# Patient Record
Sex: Male | Born: 1940 | Hispanic: No | Smoking: Current every day smoker
Health system: Southern US, Community
[De-identification: ages and names within clinical notes are randomized; demographics above are authoritative.]

## PROBLEM LIST (undated history)

## (undated) DIAGNOSIS — F32A Depression, unspecified: Secondary | ICD-10-CM

## (undated) DIAGNOSIS — E539 Vitamin B deficiency, unspecified: Secondary | ICD-10-CM

## (undated) DIAGNOSIS — I10 Essential (primary) hypertension: Secondary | ICD-10-CM

## (undated) DIAGNOSIS — F329 Major depressive disorder, single episode, unspecified: Secondary | ICD-10-CM

## (undated) DIAGNOSIS — E559 Vitamin D deficiency, unspecified: Secondary | ICD-10-CM

## (undated) DIAGNOSIS — K279 Peptic ulcer, site unspecified, unspecified as acute or chronic, without hemorrhage or perforation: Secondary | ICD-10-CM

## (undated) DIAGNOSIS — R739 Hyperglycemia, unspecified: Secondary | ICD-10-CM

## (undated) DIAGNOSIS — I2510 Atherosclerotic heart disease of native coronary artery without angina pectoris: Secondary | ICD-10-CM

## (undated) DIAGNOSIS — H269 Unspecified cataract: Secondary | ICD-10-CM

## (undated) DIAGNOSIS — J449 Chronic obstructive pulmonary disease, unspecified: Secondary | ICD-10-CM

## (undated) DIAGNOSIS — I509 Heart failure, unspecified: Secondary | ICD-10-CM

## (undated) DIAGNOSIS — E785 Hyperlipidemia, unspecified: Secondary | ICD-10-CM

## (undated) DIAGNOSIS — T148XXA Other injury of unspecified body region, initial encounter: Secondary | ICD-10-CM

## (undated) HISTORY — DX: Essential (primary) hypertension: I10

## (undated) HISTORY — DX: Vitamin D deficiency, unspecified: E55.9

## (undated) HISTORY — DX: Heart failure, unspecified: I50.9

## (undated) HISTORY — PX: HAND SURGERY: SHX662

## (undated) HISTORY — DX: Vitamin B deficiency, unspecified: E53.9

## (undated) HISTORY — DX: Hyperlipidemia, unspecified: E78.5

## (undated) HISTORY — DX: Peptic ulcer, site unspecified, unspecified as acute or chronic, without hemorrhage or perforation: K27.9

## (undated) HISTORY — PX: BACK SURGERY: SHX140

## (undated) HISTORY — DX: Other injury of unspecified body region, initial encounter: T14.8XXA

## (undated) HISTORY — DX: Unspecified cataract: H26.9

## (undated) HISTORY — DX: Hyperglycemia, unspecified: R73.9

## (undated) HISTORY — DX: Depression, unspecified: F32.A

## (undated) HISTORY — DX: Major depressive disorder, single episode, unspecified: F32.9

## (undated) HISTORY — DX: Chronic obstructive pulmonary disease, unspecified: J44.9

## (undated) HISTORY — DX: Atherosclerotic heart disease of native coronary artery without angina pectoris: I25.10

---

## 2003-05-21 ENCOUNTER — Other Ambulatory Visit: Payer: Self-pay

## 2003-06-03 ENCOUNTER — Other Ambulatory Visit: Payer: Self-pay

## 2005-06-09 ENCOUNTER — Emergency Department: Payer: Self-pay | Admitting: Emergency Medicine

## 2005-09-03 ENCOUNTER — Inpatient Hospital Stay: Payer: Self-pay | Admitting: Internal Medicine

## 2005-10-02 ENCOUNTER — Encounter: Payer: Self-pay | Admitting: Cardiovascular Disease

## 2005-10-14 ENCOUNTER — Emergency Department: Payer: Self-pay

## 2006-01-01 ENCOUNTER — Other Ambulatory Visit: Payer: Self-pay

## 2006-01-01 ENCOUNTER — Emergency Department: Payer: Self-pay | Admitting: Emergency Medicine

## 2006-01-05 ENCOUNTER — Emergency Department: Payer: Self-pay | Admitting: Emergency Medicine

## 2006-02-04 ENCOUNTER — Other Ambulatory Visit: Payer: Self-pay

## 2006-02-04 ENCOUNTER — Emergency Department: Payer: Self-pay | Admitting: Unknown Physician Specialty

## 2006-03-28 ENCOUNTER — Other Ambulatory Visit: Payer: Self-pay

## 2006-03-28 ENCOUNTER — Inpatient Hospital Stay: Payer: Self-pay | Admitting: Internal Medicine

## 2007-02-09 ENCOUNTER — Inpatient Hospital Stay: Payer: Self-pay | Admitting: Internal Medicine

## 2007-02-09 ENCOUNTER — Other Ambulatory Visit: Payer: Self-pay

## 2007-02-26 ENCOUNTER — Other Ambulatory Visit: Payer: Self-pay

## 2007-02-26 ENCOUNTER — Inpatient Hospital Stay: Payer: Self-pay | Admitting: Internal Medicine

## 2007-07-12 ENCOUNTER — Inpatient Hospital Stay: Payer: Self-pay | Admitting: Internal Medicine

## 2007-08-22 ENCOUNTER — Other Ambulatory Visit: Payer: Self-pay

## 2007-08-22 ENCOUNTER — Emergency Department: Payer: Self-pay | Admitting: Emergency Medicine

## 2008-04-29 ENCOUNTER — Inpatient Hospital Stay: Payer: Self-pay | Admitting: Internal Medicine

## 2008-06-16 ENCOUNTER — Ambulatory Visit: Payer: Self-pay | Admitting: Internal Medicine

## 2008-07-02 ENCOUNTER — Encounter: Payer: Self-pay | Admitting: Cardiovascular Disease

## 2008-07-21 ENCOUNTER — Encounter: Payer: Self-pay | Admitting: Cardiovascular Disease

## 2008-10-21 ENCOUNTER — Inpatient Hospital Stay: Payer: Self-pay | Admitting: Internal Medicine

## 2008-11-19 ENCOUNTER — Inpatient Hospital Stay: Payer: Self-pay | Admitting: Internal Medicine

## 2009-01-18 ENCOUNTER — Inpatient Hospital Stay: Payer: Medicare Other | Admitting: Internal Medicine

## 2009-02-18 ENCOUNTER — Ambulatory Visit: Payer: Self-pay | Admitting: Cardiovascular Disease

## 2009-02-18 DIAGNOSIS — R000 Tachycardia, unspecified: Secondary | ICD-10-CM

## 2009-02-18 DIAGNOSIS — R062 Wheezing: Secondary | ICD-10-CM

## 2009-02-18 DIAGNOSIS — R05 Cough: Secondary | ICD-10-CM

## 2009-02-18 DIAGNOSIS — R0602 Shortness of breath: Secondary | ICD-10-CM | POA: Insufficient documentation

## 2009-02-18 DIAGNOSIS — J4489 Other specified chronic obstructive pulmonary disease: Secondary | ICD-10-CM | POA: Insufficient documentation

## 2009-02-18 DIAGNOSIS — J449 Chronic obstructive pulmonary disease, unspecified: Secondary | ICD-10-CM

## 2009-02-18 DIAGNOSIS — E785 Hyperlipidemia, unspecified: Secondary | ICD-10-CM | POA: Insufficient documentation

## 2009-02-18 DIAGNOSIS — I2510 Atherosclerotic heart disease of native coronary artery without angina pectoris: Secondary | ICD-10-CM | POA: Insufficient documentation

## 2009-02-18 DIAGNOSIS — F17200 Nicotine dependence, unspecified, uncomplicated: Secondary | ICD-10-CM | POA: Insufficient documentation

## 2009-02-25 ENCOUNTER — Telehealth: Payer: Self-pay | Admitting: Cardiovascular Disease

## 2009-02-26 ENCOUNTER — Encounter: Payer: Self-pay | Admitting: Cardiovascular Disease

## 2009-04-12 ENCOUNTER — Encounter: Payer: Self-pay | Admitting: Cardiovascular Disease

## 2009-04-16 ENCOUNTER — Encounter: Payer: Self-pay | Admitting: Cardiovascular Disease

## 2009-05-04 ENCOUNTER — Encounter: Payer: Self-pay | Admitting: Cardiovascular Disease

## 2009-05-25 ENCOUNTER — Ambulatory Visit: Payer: Self-pay | Admitting: Cardiovascular Disease

## 2009-08-21 ENCOUNTER — Encounter: Payer: Self-pay | Admitting: Cardiovascular Disease

## 2009-08-21 ENCOUNTER — Ambulatory Visit: Payer: Self-pay | Admitting: Internal Medicine

## 2009-08-21 ENCOUNTER — Inpatient Hospital Stay: Payer: Medicare Other | Admitting: Internal Medicine

## 2009-08-22 ENCOUNTER — Encounter: Payer: Self-pay | Admitting: Cardiovascular Disease

## 2009-08-24 ENCOUNTER — Encounter: Payer: Self-pay | Admitting: Cardiovascular Disease

## 2009-09-08 ENCOUNTER — Ambulatory Visit: Payer: Self-pay | Admitting: Cardiovascular Disease

## 2009-09-08 ENCOUNTER — Inpatient Hospital Stay: Payer: Medicare Other | Admitting: Surgery

## 2009-09-11 ENCOUNTER — Encounter: Payer: Self-pay | Admitting: Cardiovascular Disease

## 2009-09-12 LAB — AFP TUMOR MARKER: AFP-Tumor Marker: 2.3 ng/mL (ref 0.0–8.3)

## 2009-09-12 LAB — CEA: CEA: 6.6 ng/mL — ABNORMAL HIGH (ref 0.0–4.7)

## 2009-10-06 ENCOUNTER — Ambulatory Visit: Payer: Medicare Other | Admitting: Gastroenterology

## 2009-10-13 ENCOUNTER — Emergency Department: Payer: Medicare Other | Admitting: Emergency Medicine

## 2009-11-11 ENCOUNTER — Ambulatory Visit: Payer: Medicare Other | Admitting: Gastroenterology

## 2009-11-13 LAB — PATHOLOGY REPORT

## 2010-01-04 ENCOUNTER — Emergency Department: Payer: Medicare Other | Admitting: Emergency Medicine

## 2010-02-15 NOTE — Letter (Signed)
[facility]   Imported By: Harlon Flor 09/16/2009 10:36:48  _____________________________________________________________________  External Attachment:    Type:   Image     Comment:   External Document

## 2010-02-15 NOTE — Consult Note (Signed)
[facility]   Imported By: Harlon Flor 08/23/2009 10:35:48  _____________________________________________________________________  External Attachment:    Type:   Image     Comment:   External Document

## 2010-02-15 NOTE — Assessment & Plan Note (Signed)
Summary: F3M/AMD   Visit Type:  Follow-up Primary Provider:  Lajoyce Lauber, MD  CC:  no cp, some chest muscle spasms. Short of breath, worse with walking. Starting O2 stat 96%with 2 liters, and walked down hall went down to 90% with 2l of O2. After sitting for a minute went back up to 96% with 2L. Not many problems with swelling in ankle or feet. Marland Kitchen  History of Present Illness: 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  He uses nebulizers at home, 4 times a day. Sometimes he uses a double dose of medication in his nebulizer. Usessymbicort, albuterol inhaler and Spiriva. he has cut back on his smoking to one half pack per day and actually shows me the cigarette package. He continues to have severe shortness of breath with any exertion and uses chronic oxygen. He denies any significant chest pain, chest tightness or neck discomfort or back discomfort.  he has been taking Lasix 40 mg daily although the package asked him to take 60. He has not had any significant lower extremity edema and some time and states that he is able to get his shoes on with no problem.   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 2 Puffs Two Times A Day 2)  Lisinopril 5 Mg Tabs (Lisinopril) .... Take One Tablet By Mouth Daily 3)  Lipitor 20 Mg Tabs (Atorvastatin Calcium) .... Take One Tablet By Mouth At Bedtime. 4)  Omeprazole 40 Mg Cpdr (Omeprazole) .Marland Kitchen.. 1 Tab By Mouth Once Daily 5)  Aspirin 81 Mg Tbec (Aspirin) .... Take One Tablet By Mouth Daily 6)  Singulair 10 Mg Tabs (Montelukast Sodium) .Marland Kitchen.. 1 Tab By Mouth Once Daily 7)  Theo-24 300 Mg Xr24h-Cap (Theophylline) .Marland Kitchen.. 1 Tab By Mouth Once Daily 8)  Spiriva Handihaler 18 Mcg Caps (Tiotropium Bromide Monohydrate) .... As Needed 9)  Furosemide 40 Mg Tabs (Furosemide) .... Take One Tablet By Mouth Daily. 10)  Albuterol Sulfate (2.5 Mg/70ml) 0.083% Nebu (Albuterol Sulfate) .... As Needed 11)  Ipratropium-Albuterol 0.5-2.5 (3) Mg/80ml Soln (Ipratropium-Albuterol) .... As Needed 12)  Spironolactone 25 Mg Tabs (Spironolactone) .Marland Kitchen.. 1 Tab By Mouth Once Daily 13)  Alprazolam 0.25 Mg Tabs (Alprazolam) .Marland Kitchen.. 1 Tab By Mouth Once Daily 14)  Bystolic 5 Mg Tabs (Nebivolol Hcl) .... 1/2 By Mouth Once Daily 15)  Chlorzoxazone 500 Mg Tabs (Chlorzoxazone) .Marland Kitchen.. 1 By Mouth Two Times A Day As Needed For Muscle Spasm  Allergies (verified): 1)  ! Penicillin  Past History:  Past Medical History: Last updated: 02/10/2009 1. COPD on home O2 of at least 2L, sometimes 3L.  He was last hospitalized November 2010. Prior to that, October 2010. 2. Hypertension. 3. Hyperlipidemia. 4. CAD. Reports his last catheterization was at least 3-4 years ago b Dr. Juliann Pares. Apparently, he was suppose to be scheduled for what sounds like a stress test this coming week. 5. History of CHF with systolic and diastolic dysfunction. He has an EF of 45% and moderate LVH. An Echocardiogram November 2010. 6. Depression 7. Steriod-induced hyperglycemia.  A1c from November 2010 was 6.2. 8. Peptic ulcer disease. 9. Cataracts.  Past Surgical History: Last updated: 02/10/2009 1. Back Surgery 2. Hand Surgery  Family History: Last updated: 02/10/2009 Mother: Had a stroke and diabetes. Siblings: Sister has asthma and diabetes.  Social History: Last updated: 02/10/2009 Single  Tobacco Use - Yes.  Alcohol Use - no Drug Use - no  Risk Factors: Alcohol Use: 0 (05/25/2009) Caffeine Use: 1 cup (05/25/2009)  Risk Factors: Smoking Status: current (05/25/2009)  Social History: Alcohol drinks/day:  0 Caffeine use/day:  1 cup  Review of Systems       The patient complains of dyspnea on exertion.  The patient denies fever, weight loss, weight gain, vision loss, decreased hearing, hoarseness, chest pain, syncope, peripheral edema, prolonged cough, abdominal pain, incontinence, muscle weakness, depression, and enlarged lymph nodes.    Vital Signs:  Patient profile:   70 year old male Height:      70 inches Weight:      197.50 pounds BMI:     28.44 Pulse rate:   90 / minute Pulse rhythm:   regular BP sitting:   132 / 80  (left arm) Cuff size:   regular  Vitals Entered By: Mercer Pod (May 25, 2009 3:23 PM)  Physical Exam  General:  elderly gentleman who is very short of breath, audible wheezing on walking to the exam room, appears comfortable on oxygen though still short of breath, HEENT exam is benign, oropharynx is clear, neck is supple with no JVP or carotid bruits, heart sounds are regular but distant with S1-S2, lungs are clear though diminished bilaterally, abdominal exam is benign, no significant lower extremity edema, neurologic exam is nonfocal skin is warm and dry.   Impression & Recommendations:  Problem # 1:  CAD, NATIVE VESSEL (ICD-414.01)  history of CAD, on aspirin and statin. No symptoms of chest pain or anginal equivalent. Recent stress test and catheterization. We have been having difficulty obtaining  these reports.  Heart rate is improved from his last clinic visit though still mildly elevated. This is likely due to being on theophylline and due to his underlying severe COPD. If he has no side effects from bystolic, this could be increased to 5 mg daily from 2.5 mg daily. His blood pressure would tolerate this easily.   The following medications were removed from the medication list:    Diltiazem Hcl Er Beads 180 Mg Xr24h-cap (Diltiazem hcl er beads) .Marland Kitchen... 1 tab by mouth daily His updated medication list for this problem includes:    Lisinopril 5 Mg Tabs (Lisinopril) .Marland Kitchen... Take one tablet by mouth daily    Aspirin 81 Mg Tbec (Aspirin) .Marland Kitchen... Take one tablet by mouth daily    Bystolic 5 Mg Tabs (Nebivolol hcl) .Marland Kitchen... 1/2 by mouth once daily  The following medications were removed from the medication list:    Diltiazem Hcl Er Beads 180 Mg Xr24h-cap (Diltiazem hcl er beads) .Marland Kitchen... 1 tab by mouth daily His updated medication list for this problem includes:    Lisinopril 5 Mg Tabs (  Lisinopril) .Marland Kitchen... Take one tablet by mouth daily    Aspirin 81 Mg Tbec (Aspirin) .Marland Kitchen... Take one tablet by mouth daily    Bystolic 5 Mg Tabs (Nebivolol hcl) .Marland Kitchen... 1/2 by mouth once daily  Problem # 2:  HYPERLIPIDEMIA-MIXED (ICD-272.4)  We'll continue on Lipitor at its current dose. We'll try to obtain his most recent lipid panel from Dr. Beverely Risen.  His updated medication list for this problem includes:    Lipitor 20 Mg Tabs (Atorvastatin calcium) .Marland Kitchen... Take one tablet by mouth at bedtime.  His updated medication list for this problem includes:    Lipitor 20 Mg Tabs (Atorvastatin calcium) .Marland Kitchen... Take one tablet by mouth at bedtime.  Problem # 3:  COPD (ICD-496) Severe COPD on chronic oxygen. He is severely limited in his ability to exert himself to 2 shortness of breath. We have strongly encouraged him to stop smoking and he states that he is trying and is now down to one half pack per day. We have counseled him  about smoking he is well aware of the problems as he now has severe COPD. he likely has pulmonary hypertension has been managed with daily Lasix. His appears to be under adequate control with no significant lower extremity edema.   The following medications were removed from the medication list:    Xopenex Hfa 45 Mcg/act Aero (Levalbuterol tartrate) .Marland Kitchen... 2 puffs every morning and every evening and as needed for shortness of breath His updated medication list for this problem includes:    Symbicort 80-4.5 Mcg/act Aero (Budesonide-formoterol fumarate) .Marland Kitchen... 2 puffs two times a day    Singulair 10 Mg Tabs (Montelukast sodium) .Marland Kitchen... 1 tab by mouth once daily    Theo-24 300 Mg Xr24h-cap (Theophylline) .Marland Kitchen... 1 tab by mouth once daily    Spiriva Handihaler 18 Mcg Caps (Tiotropium bromide monohydrate) .Marland Kitchen... As needed    Albuterol Sulfate (2.5 Mg/62ml) 0.083% Nebu (Albuterol sulfate) .Marland Kitchen... As needed    Ipratropium-albuterol 0.5-2.5 (3) Mg/39ml Soln (Ipratropium-albuterol) .Marland Kitchen... As needed

## 2010-02-15 NOTE — Letter (Signed)
[facility]   Imported By: Harlon Flor 09/08/2009 16:29:29  _____________________________________________________________________  External Attachment:    Type:   Image     Comment:   External Document

## 2010-02-15 NOTE — Progress Notes (Signed)
Summary: BP issues  Phone Note From Other Clinic   Caller: Vanessa with [facility] Summary of Call: Pts BP low - ranging from 78-98 systolic.  Has been running low since starting bystolic.  instructed Erie Noe to have pt stop bystolic and increase dilt to 045 per last office note.  Erie Noe will have teleport call tomorrow with pts BP.      New/Updated Medications: DILTIAZEM HCL ER BEADS 180 MG XR24H-CAP (DILTIAZEM HCL ER BEADS) 1 tab by mouth daily Prescriptions: DILTIAZEM HCL ER BEADS 180 MG XR24H-CAP (DILTIAZEM HCL ER BEADS) 1 tab by mouth daily  #30 x 6   Entered by:   Charlena Cross, RN, BSN   Authorized by:   Dossie Arbour MD   Signed by:   Charlena Cross, RN, BSN on 02/25/2009   Method used:   Electronically to        [facility]* (retail)       [address] [place], [place]  [address]       Ph: [phone]       Fax: [phone]   RxID:   [id number]

## 2010-02-15 NOTE — Consult Note (Signed)
[facility]   Imported By: Harlon Flor 08/23/2009 10:36:05  _____________________________________________________________________  External Attachment:    Type:   Image     Comment:   External Document

## 2010-02-15 NOTE — Assessment & Plan Note (Signed)
Summary: New PT   Visit Type:  New patient  CC:  SOB, chest pain, swelling in both feet, and and muscle spasms.  History of Present Illness: 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  [name] states that his shortness of breath is severe. He is seen by Dr. Doylene Canard who helps diminish his emphysema. He uses nebulizers at home, and simple cord about daily as well as p.r.n. basis. He seems to think that simple cord on a p.r.n. basis might help his symptoms. He states that he does very small type of oxygen and a short amount of time. He also states that his heart was stopped several weeks ago by Dr. Lennette Bihari presumably to make sure that this was a contributing to his COPD and shortness of breath. Diltiazem was started presumably for her elevated heart rate.  [name] was preceded by Dr. Juliann Pares and he mentioned that he had a recent stress test in July 2010 though the results are uncertain. He also has had a prior catheterization in the recent past though these results are also unavailable to [place] at this time.[name] denies any significant chest pain, syncope or near syncope. He does have a chronic deep cough.  Current Medications (verified): 1)  Symbicort 80-4.5 Mcg/act Aero (Budesonide-Formoterol Fumarate) .... 2 Puffs Two Times A Day 2)  Lisinopril 10 Mg Tabs (Lisinopril) .... Take One Tablet By Mouth Daily 3)  Lipitor 20 Mg Tabs (Atorvastatin Calcium) .... Take One Tablet By Mouth At Bedtime. 4)  Omeprazole 40 Mg Cpdr (Omeprazole) .Marland Kitchen.. 1 Tab By Mouth Once Daily 5)  Magnesium Oxide 400 Mg Tabs (Magnesium Oxide) .Marland Kitchen.. 1 Tab By Mouth Two Times A Day 6)   Aspirin 81 Mg Tbec (Aspirin) .... Take One Tablet By Mouth Daily 7)  Singulair 10 Mg Tabs (Montelukast Sodium) .Marland Kitchen.. 1 Tab By Mouth Once Daily 8)  Theo-24 300 Mg Xr24h-Cap (Theophylline) .Marland Kitchen.. 1 Tab By Mouth Once Daily 9)  Spiriva Handihaler 18 Mcg Caps (Tiotropium Bromide Monohydrate) .... As Needed 10)  Lasix 80 Mg Tabs (Furosemide) .Marland Kitchen.. 1 Tablet Two Times A Day 11)  Diltiazem Hcl Er Beads 120 Mg Xr24h-Cap (Diltiazem Hcl Er Beads) .... Take One Capsule By Mouth Twice A Day 12)  Albuterol Sulfate (2.5 Mg/2ml) 0.083% Nebu (Albuterol Sulfate) .... As Needed 13)  Ipratropium-Albuterol 0.5-2.5 (3) Mg/61ml Soln (Ipratropium-Albuterol) .... As Needed 14)  Bupropion Hcl 150 Mg Xr12h-Tab (Bupropion Hcl) .Marland Kitchen.. 1 Tablet By Mouth Once Daily 15)  Spironolactone 25 Mg Tabs (Spironolactone) .Marland Kitchen.. 1 Tab By Mouth Once Daily 16)  Prednisone 5 Mg Tabs (Prednisone) .... 2 Tablets By Mouth Once Daily 17)  Daily Vitamins  Tabs (Multiple Vitamin) .Marland Kitchen.. 1 Tablet By Mouth Once Daily 18)  Alprazolam 0.25 Mg Tabs (Alprazolam) .Marland Kitchen.. 1 Tab By Mouth Once Daily 19)  Nicoderm Cq 14 Mg/24hr Pt24 (Nicotine) .... Apply 1 Patch Daily  Allergies (verified): 1)  ! Penicillin  Past History:  Past Medical History: Last updated: 02/10/2009 1. COPD on home O2 of at least 2L, sometimes 3L. He was last hospitalized November 2010. Prior to that, October 2010. 2. Hypertension. 3. Hyperlipidemia. 4. CAD. Reports his last catheterization was at least 3-4 years  ago b Dr. Juliann Pares. Apparently, he was suppose to be scheduled for what sounds like a stress test this coming week. 5. History of CHF with systolic and diastolic dysfunction. He has an EF of 45% and moderate LVH. An Echocardiogram November 2010. 6. Depression 7. Steriod-induced hyperglycemia. A1c from November 2010 was 6.2. 8. Peptic ulcer disease. 9. Cataracts.  Past Surgical History: Last updated: 02/10/2009 1. Back Surgery 2. Hand Surgery  Family History: Last updated:  02/10/2009 Mother: Had a stroke and diabetes. Siblings: Sister has asthma and diabetes.  Social History: Last updated: 02/10/2009 Single  Tobacco Use - Yes.  Alcohol Use - no Drug Use - no  Risk Factors: Smoking Status: current (02/10/2009)  Review of Systems       abdominal swelling  Vital Signs:  Patient profile:   70 year old male Height:      70 inches Weight:      213.25 pounds BMI:     30.71 Pulse rate:   118 / minute Pulse rhythm:   regular BP sitting:   132 / 82  (left arm) Cuff size:   large  Vitals Entered By: Mercer Pod (February 18, 2009 2:36 PM)  Physical Exam  General:  [name] is on nasal cannula oxygen, appears short of breath with any movement or talking, HEENT exam is benign, oropharynx is clear, neck is supple with no JVP or carotid bruits, heart sounds are regular with S1-S2 and 2/6 systolic ejection murmur at the left sternal border, lungs are clear to auscultation though moderately diminished throughout bilaterally, normal exam is notable for obesity, soft, nontender. He has trace lower extremity edema around his ankles bilaterally, neurological exam is grossly nonfocal the full exam was not completed, skin is warm and dry, pulses are equal and symmetrical in his upper and lower extremities.    EKG  Procedure date:  02/18/2009  Findings:      EKG shows sinus tachycardia with a rate of 110 beats per minute, rare PVC, right bundle branch block, left axis deviation. No prior EKG available for comparison at this time.  Impression & Recommendations:  Problem # 1:  SHORTNESS OF BREATH (ICD-786.05) [name] shortness of breath is likely due predominantly to his severe COPD. He is on chronic nasal cannula oxygen. Per his report, he has underlying coronary artery disease. He had a stress test in July 2010 and we will try to obtain this report for our records. He also has had a cardiac catheterization and will also try to obtain this report  for our records. He is currently on aggressive medical management for his coronary disease. We'll try to obtain his most recent lipid panel for our review. I have suggested to him that he increase his aspirin to 81 mg x2 daily basis.[name] does need a rescue inhaler he is using a similar core, p.r.n. basis throughout the day. We have written a prescription for Xopenex inhaler. The following medications were removed from the medication list:    Carvedilol 3.125 Mg Tabs (Carvedilol) .Marland Kitchen... Take one tablet by mouth twice a day His updated medication list for this problem includes:    Lisinopril 10 Mg Tabs (Lisinopril) .Marland Kitchen... Take one tablet by mouth daily    Aspirin 81 Mg Tbec (Aspirin) .Marland Kitchen... Take one tablet by mouth daily    Lasix 80 Mg Tabs (Furosemide) .Marland Kitchen... 1 tablet two times a day    Diltiazem Hcl Er Beads 120 Mg Xr24h-cap (Diltiazem hcl er beads) .Marland Kitchen... Take one  capsule by mouth twice a day    Spironolactone 25 Mg Tabs (Spironolactone) .Marland Kitchen... 1 tab by mouth once daily    Bystolic 5 Mg Tabs (Nebivolol hcl) .Marland Kitchen... 1 tab by mouth daily  Problem # 2:  TACHYCARDIA (ICD-785) the etiology of his tachycardia is likely multifactorial. He has been told that he has an elevated heart rate on numerous times in the past. His heart rate is 110 on today's visit. I suspect that his heart rate is elevated due to his underlying COPD. He does have moderate LVH and I suspect diastolic dysfunction. His ejection fraction is moderately reduced. As the rate is increased, his shortness of breath will certainly improve given his underlying cardiac pathology.  An effort to slow his heart rate, we will continue the diltiazem dose start by bystolic 5 mg daily. I suspect that he might need a higher dose such as 10 mg a daily basis. We'll see him back in one to 2 months to see how heart rate is running.  Problem # 3:  HYPERLIPIDEMIA-MIXED (ICD-272.4) [name] is currently on Lipitor and we'll try to obtain his most recent  cholesterol panel for our records. His updated medication list for this problem includes:    Lipitor 20 Mg Tabs (Atorvastatin calcium) .Marland Kitchen... Take one tablet by mouth at bedtime.  Problem # 4:  CAD, NATIVE VESSEL (ICD-414.01) per his report, Mr. Zella Ball is coronary disease. We'll try to obtain his most recent heart catheterization and stress test for our records. The following medications were removed from the medication list:    Carvedilol 3.125 Mg Tabs (Carvedilol) .Marland Kitchen... Take one tablet by mouth twice a day His updated medication list for this problem includes:    Lisinopril 10 Mg Tabs (Lisinopril) .Marland Kitchen... Take one tablet by mouth daily    Aspirin 81 Mg Tbec (Aspirin) .Marland Kitchen... Take one tablet by mouth daily    Diltiazem Hcl Er Beads 120 Mg Xr24h-cap (Diltiazem hcl er beads) .Marland Kitchen... Take one capsule by mouth twice a day    Bystolic 5 Mg Tabs (Nebivolol hcl) .Marland Kitchen... 1 tab by mouth daily  Patient Instructions: 1)  Your physician recommends that you schedule a follow-up appointment in: 3 months 2)  Your physician has recommended you make the following change in your medication: start xopenex inhaler twice in the morning and twice in the evening and as needed for shortness of breath, start bystolic 5 mg daiyl Prescriptions: BYSTOLIC 5 MG TABS (NEBIVOLOL HCL) 1 tab by mouth daily  #30 x 6   Entered by:   Charlena Cross, RN, BSN   Authorized by:   Ryan Arbour MD   Signed by:   Charlena Cross, RN, BSN on 02/18/2009   Method used:   Electronically to        [facility]* (retail)       [address] [place], [place]  [address]       Ph: [phone]       Fax: [phone]   RxID:   [id number] XOPENEX HFA 45 MCG/ACT AERO (LEVALBUTEROL TARTRATE) 2 puffs every morning and every evening and as needed for shortness of breath  #1 x 6   Entered by:   Charlena Cross, RN, BSN   Authorized by:   Ryan Arbour MD   Signed by:   Charlena Cross, RN, BSN on  02/18/2009   Method used:   Electronically to  [facility]* (retail)       [address] [place], [place]  [address]       Ph: [phone]       Fax: [phone]   RxID:   [id number]   Appended Document: New PT Contacted by Dr. Beverely Risen. The patient has difficulty paying for medications. She will provide some xopenex inh for as needed. She would like to d/c the bystolic and incresae the dose of the diltiazem for improved rate control. She will increse the dose to 180 mg by mouth daily.   Appended Document: New PT ECHO from outside facility showing EF of 20% with moderate MR. Biatrial enlargement. RVSP not measured.  Stress dobutamin was subtherapeutic as taret HR not achieved. ?Minimal ischemia, apical scar.

## 2010-03-01 ENCOUNTER — Encounter: Payer: Medicare Other | Admitting: Internal Medicine

## 2010-03-17 ENCOUNTER — Encounter: Payer: Medicare Other | Admitting: Internal Medicine

## 2010-04-17 ENCOUNTER — Encounter: Payer: Medicare Other | Admitting: Internal Medicine

## 2010-05-17 ENCOUNTER — Encounter: Payer: Medicare Other | Admitting: Internal Medicine

## 2010-06-03 ENCOUNTER — Inpatient Hospital Stay: Payer: Medicare Other | Admitting: Internal Medicine

## 2010-06-17 ENCOUNTER — Encounter: Payer: Medicare Other | Admitting: Internal Medicine

## 2010-07-17 ENCOUNTER — Encounter: Payer: Medicare Other | Admitting: Internal Medicine

## 2010-08-02 ENCOUNTER — Encounter: Payer: Self-pay | Admitting: Cardiovascular Disease

## 2010-09-20 ENCOUNTER — Inpatient Hospital Stay: Payer: Medicare Other | Admitting: Internal Medicine

## 2010-09-20 ENCOUNTER — Ambulatory Visit: Payer: Medicare Other

## 2010-09-21 ENCOUNTER — Encounter: Payer: Self-pay | Admitting: Cardiovascular Disease

## 2010-09-21 DIAGNOSIS — R079 Chest pain, unspecified: Secondary | ICD-10-CM

## 2010-10-05 ENCOUNTER — Encounter: Payer: Self-pay | Admitting: *Deleted

## 2010-10-05 ENCOUNTER — Encounter: Payer: Self-pay | Admitting: Cardiovascular Disease

## 2010-10-05 ENCOUNTER — Ambulatory Visit (INDEPENDENT_AMBULATORY_CARE_PROVIDER_SITE_OTHER): Payer: Medicare Other | Admitting: Cardiovascular Disease

## 2010-10-05 DIAGNOSIS — Z01810 Encounter for preprocedural cardiovascular examination: Secondary | ICD-10-CM

## 2010-10-05 DIAGNOSIS — J449 Chronic obstructive pulmonary disease, unspecified: Secondary | ICD-10-CM

## 2010-10-05 DIAGNOSIS — F17200 Nicotine dependence, unspecified, uncomplicated: Secondary | ICD-10-CM

## 2010-10-05 DIAGNOSIS — R0602 Shortness of breath: Secondary | ICD-10-CM

## 2010-10-05 DIAGNOSIS — I2510 Atherosclerotic heart disease of native coronary artery without angina pectoris: Secondary | ICD-10-CM

## 2010-10-05 DIAGNOSIS — E785 Hyperlipidemia, unspecified: Secondary | ICD-10-CM

## 2010-10-05 DIAGNOSIS — R079 Chest pain, unspecified: Secondary | ICD-10-CM | POA: Insufficient documentation

## 2010-10-05 NOTE — Assessment & Plan Note (Signed)
Despite his profoundly bad COPD and symptoms, he continues to smoke. Smoking cessation has always been progressively mentioned.

## 2010-10-05 NOTE — Assessment & Plan Note (Signed)
He has had frequent episodes of chest pain, is unable to perform any of the stress test. He was unable to lie flat for the cardiac catheterization in the setting of severe COPD exacerbation. He is still interested in having a cardiac catheterization done and at his request, we will try to reschedule this for next week.

## 2010-10-05 NOTE — Assessment & Plan Note (Signed)
Given that he is at high risk of peripheral vascular disease and CAD, would continue aggressive cholesterol management.

## 2010-10-05 NOTE — Patient Instructions (Signed)
You are doing well. No medication changes were made. We will check lab work today in preparation for the cardiac cath next week. We will call you to schedule the cardiac cath Please call us if you have new issues that need to be addressed before your next appt.  We will call you for a follow up Appt. In 6 months

## 2010-10-05 NOTE — Assessment & Plan Note (Signed)
He has end-stage COPD with recent hospital admission for COPD exacerbation, exacerbation following a dobutamine stress test. He is winding down on his prednisone. I suspect his disease will be severe to the point where he will need low-dose prednisone on a regular basis.

## 2010-10-05 NOTE — Progress Notes (Signed)
Patient ID: [name], male    DOB: [birth year], 70 y.o.   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  He was admitted to the hospital after a dobutamine stress test caused significant chest palpitations and shortness of breath. He is unable to tolerate a treadmill, lexiscan or a dobutamine stress test. He reported his shortness of breath had been getting worse over the past several weeks prior to the test, likely from COPD exacerbation. He was treated at [facility] for his COPD exacerbation. An attempt was made at a cardiac catheterization though he was unable to lie flat secondary to his COPD. He was discharged after shortness of breath had improved to some degree with maximum medical therapy for his COPD. He has several days of steroids left. His legs are very weak and he is scheduled to start working with physical therapy.   He reports that he was recently seen by [facility] medical and his lisinopril was doubled to 10 mg b.i.d. And Lasix was increased to 60 mg daily.  He is very concerned about recent episodes of chest pain. He would like to try to reschedule his cardiac catheterization.  EKG shows sinus tachycardia with rate 101 beats per minute, right bundle branch block, left axis deviation      Outpatient Encounter Prescriptions as of 10/05/2010  Medication Sig Dispense Refill  . albuterol (PROVENTIL) (2.5 MG/3ML) 0.083% nebulizer solution Take 2.5 mg by nebulization as needed.        . ALPRAZolam (XANAX) 0.25 MG tablet Take 0.25 mg by mouth daily.        Marland Kitchen aspirin (ASPIR-81) 81  MG EC tablet Take 81 mg by mouth daily.        Marland Kitchen atorvastatin (LIPITOR) 20 MG tablet Take 20 mg by mouth at bedtime.        . chlorzoxazone (PARAFON) 500 MG tablet Take 500 mg by mouth 2 (two) times daily. As needed for muscle spasm       . cyclobenzaprine (FEXMID) 7.5 MG tablet Take 7.5 mg by mouth 2 (two) times daily as needed.        . furosemide (LASIX) 40 MG tablet Take one and one half tablet daily.      Marland Kitchen gabapentin (NEURONTIN) 300 MG capsule Take 300 mg by mouth 1 day or 1 dose.        . hydrocodone-acetaminophen (LORCET-HD) 5-500 MG per capsule Take 1 capsule by mouth every 8 (eight) hours as needed.        Marland Kitchen ipratropium-albuterol (DUONEB) 0.5-2.5 (3) MG/3ML SOLN Take 3 mLs by nebulization as needed.        Marland Kitchen lisinopril (PRINIVIL,ZESTRIL) 5 MG tablet Take 10 mg by mouth daily.       . montelukast (SINGULAIR) 10 MG tablet Take 10 mg by mouth daily.        . nebivolol (BYSTOLIC) 5 MG tablet Take 5 mg by mouth daily. Take 1/2 tab       . omeprazole (PRILOSEC) 40 MG capsule Take 40 mg by mouth daily.        . potassium chloride SA (K-DUR,KLOR-CON) 20  MEQ tablet Take 20 mEq by mouth daily.        Marland Kitchen PREDNISONE PO Take 60 mg by mouth. Taper       . roflumilast (DALIRESP) 500 MCG TABS tablet Take 500 mcg by mouth daily.        Marland Kitchen spironolactone (ALDACTONE) 25 MG tablet Take 25 mg by mouth daily.        . theophylline (THEO-24) 300 MG 24 hr capsule Take 300 mg by mouth daily.        Marland Kitchen tiotropium (SPIRIVA HANDIHALER) 18 MCG inhalation capsule Place 18 mcg into inhaler and inhale as needed.        Marland Kitchen DISCONTD: budesonide-formoterol (SYMBICORT) 80-4.5 MCG/ACT inhaler Inhale 2 puffs into the lungs 2 (two) times daily.           Review of Systems  Constitutional: Negative.   HENT: Negative.   Eyes: Negative.   Respiratory: Positive for shortness of breath.   Cardiovascular: Negative.   Gastrointestinal: Negative.   Musculoskeletal: Positive for arthralgias and gait problem.  Skin: Negative.     Neurological: Negative.   Hematological: Negative.   Psychiatric/Behavioral: Negative.   All other systems reviewed and are negative.    BP 110/60  Ht 5\' 9"  (1.753 m)  Wt 193 lb (87.544 kg)  BMI 28.50 kg/m2   Physical Exam  Nursing note and vitals reviewed. Constitutional: He is oriented to person, place, and time. He appears well-developed and well-nourished.       Sitting in a wheel chair, appears comfortable with 2 L nasal cannula  HENT:  Head: Normocephalic.  Nose: Nose normal.  Mouth/Throat: Oropharynx is clear and moist.  Eyes: Conjunctivae are normal. Pupils are equal, round, and reactive to light.  Neck: Normal range of motion. Neck supple. No JVD present.  Cardiovascular: Normal rate, regular rhythm, S1 normal, S2 normal, normal heart sounds and intact distal pulses.  Exam reveals no gallop and no friction rub.   No murmur heard. Pulmonary/Chest: No respiratory distress. He has decreased breath sounds. He has wheezes. He has rales. He exhibits no tenderness.  Abdominal: Soft. Bowel sounds are normal. He exhibits no distension. There is no tenderness.  Musculoskeletal: Normal range of motion. He exhibits no edema and no tenderness.  Lymphadenopathy:    He has no cervical adenopathy.  Neurological: He is alert and oriented to person, place, and time. Coordination normal.  Skin: Skin is warm and dry. No rash noted. No erythema.  Psychiatric: He has a normal mood and affect. His behavior is normal. Judgment and thought content normal.           Assessment and Plan

## 2010-10-06 LAB — CBC WITH DIFFERENTIAL/PLATELET
Basophils Absolute: 0 10*3/uL (ref 0.0–0.1)
Eosinophils Absolute: 0.1 10*3/uL (ref 0.0–0.7)
Eosinophils Relative: 1 % (ref 0–5)
MCH: 28.1 pg (ref 26.0–34.0)
MCHC: 31.6 g/dL (ref 30.0–36.0)
MCV: 89.1 fL (ref 78.0–100.0)
Platelets: 122 10*3/uL — ABNORMAL LOW (ref 150–400)
RDW: 15.4 % (ref 11.5–15.5)

## 2010-10-06 LAB — BASIC METABOLIC PANEL
BUN: 22 mg/dL (ref 6–23)
Chloride: 103 mEq/L (ref 96–112)
Glucose, Bld: 109 mg/dL — ABNORMAL HIGH (ref 70–99)
Potassium: 4.4 mEq/L (ref 3.5–5.3)

## 2010-10-11 ENCOUNTER — Ambulatory Visit: Payer: Medicare Other | Admitting: Cardiovascular Disease

## 2010-10-11 DIAGNOSIS — I2510 Atherosclerotic heart disease of native coronary artery without angina pectoris: Secondary | ICD-10-CM

## 2010-10-11 DIAGNOSIS — I739 Peripheral vascular disease, unspecified: Secondary | ICD-10-CM

## 2010-10-13 ENCOUNTER — Encounter: Payer: Self-pay | Admitting: Cardiovascular Disease

## 2010-10-25 ENCOUNTER — Ambulatory Visit (INDEPENDENT_AMBULATORY_CARE_PROVIDER_SITE_OTHER): Payer: Medicare Other | Admitting: Cardiovascular Disease

## 2010-10-25 ENCOUNTER — Telehealth: Payer: Self-pay | Admitting: *Deleted

## 2010-10-25 ENCOUNTER — Encounter: Payer: Self-pay | Admitting: Cardiovascular Disease

## 2010-10-25 DIAGNOSIS — E785 Hyperlipidemia, unspecified: Secondary | ICD-10-CM

## 2010-10-25 DIAGNOSIS — I2510 Atherosclerotic heart disease of native coronary artery without angina pectoris: Secondary | ICD-10-CM

## 2010-10-25 DIAGNOSIS — R0602 Shortness of breath: Secondary | ICD-10-CM

## 2010-10-25 DIAGNOSIS — R0989 Other specified symptoms and signs involving the circulatory and respiratory systems: Secondary | ICD-10-CM

## 2010-10-25 DIAGNOSIS — R079 Chest pain, unspecified: Secondary | ICD-10-CM

## 2010-10-25 DIAGNOSIS — F17200 Nicotine dependence, unspecified, uncomplicated: Secondary | ICD-10-CM

## 2010-10-25 MED ORDER — ATORVASTATIN CALCIUM 40 MG PO TABS: 40.0000 mg | ORAL_TABLET | Freq: Every day | ORAL | Status: DC

## 2010-10-25 MED ORDER — NEBIVOLOL HCL 5 MG PO TABS: 5.0000 mg | ORAL_TABLET | Freq: Every day | ORAL | Status: DC

## 2010-10-25 NOTE — Assessment & Plan Note (Signed)
No further significant chest pains since he was discharged from the hospital. No severe disease we need continued aggressive medical management.

## 2010-10-25 NOTE — Telephone Encounter (Signed)
Per TG, will incr pt's lipitor 40mg , called pt to notify. Attempted to contact pt, LMOM TCB.

## 2010-10-25 NOTE — Patient Instructions (Signed)
You are doing well. Please restart bystolic 5 mg a day Please call us if you have new issues that need to be addressed before your next appt.  We will call you for a follow up Appt. In 6 months

## 2010-10-25 NOTE — Assessment & Plan Note (Signed)
We have discussed smoking cessation with him. He continues to have difficulty and does not want any other prescription medications such as chantix.

## 2010-10-25 NOTE — Assessment & Plan Note (Signed)
Heart rate has been more elevated since the bystolic has been discontinued. Again we do not have confirmation that the beta blocker was stopped. He has been receiving calls from his monitoring service that his heart rate is sometimes higher than 110 at rest. We have suggested he restart his bystolic at 5 mg daily. I do not suspect this will contribute to any worsening COPD.

## 2010-10-25 NOTE — Progress Notes (Signed)
Patient ID: [name], male    DOB: 10/10/1940, 70 y.o.   MRN: [id number]  HPI Comments: 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  Cardiac catheterization was performed as he was unable to tolerate any stress testing.  He had previously had a dobutamine stress test that cause significant shortness of breath secondary to tachycardia.  Cardiac catheterization performed October 11 2010 showed mild proximal left circumflex disease, moderate mid left circumflex disease, mild proximal LAD disease, ejection fraction 45-50%, severe right internal iliac arterial disease ( 30% diffuse proximal LAD disease, 40% proximal left circumflex, 50% diffuse mid left circumflex after the OM 2) Medical management was recommended  Since his discharge, he reports that some of his medications were changed and he now takes 2 in the morning and 2 at night.. This could be lisinopril but we are not sure as we do not have the note from [facility] medical. He also reports his bystolic was discontinued but we do not have confirmation of this. He reports that since the beta blocker was discontinued, his heart rate has been elevated. He has received phone calls from the monitoring service that his heart rate is typically greater than 100. These are recent phone calls since the medication change.   EKG shows sinus tachycardia with rate 103 beats per minute, right bundle branch block, left axis deviation      Outpatient Encounter Prescriptions as of 10/25/2010  Medication Sig Dispense Refill  . albuterol (PROVENTIL) (2.5 MG/3ML) 0.083% nebulizer solution Take 2.5 mg  by nebulization as needed.        . ALPRAZolam (XANAX) 0.25 MG tablet Take 0.25 mg by mouth daily.        Marland Kitchen aspirin (ASPIR-81) 81 MG EC tablet Take 81 mg by mouth daily.        Marland Kitchen atorvastatin (LIPITOR) 20 MG tablet Take 20 mg by mouth at bedtime.        . chlorzoxazone (PARAFON) 500 MG tablet Take 500 mg by mouth 2 (two) times daily. As needed for muscle spasm       . cyclobenzaprine (FEXMID) 7.5 MG tablet Take 7.5 mg by mouth 2 (two) times daily as needed.        . furosemide (LASIX) 40 MG tablet Take one and one half tablet daily.      Marland Kitchen gabapentin (NEURONTIN) 300 MG capsule Take 300 mg by mouth 1 day or 1 dose.        Marland Kitchen ipratropium-albuterol (DUONEB) 0.5-2.5 (3) MG/3ML SOLN Take 3 mLs by nebulization as needed.        Marland Kitchen lisinopril (PRINIVIL,ZESTRIL) 5 MG tablet Take 10 mg by mouth daily.       . Meth-Hyo-M Bl-Na Phos-Ph Sal (URIBEL PO) Take by mouth 4 (four) times daily.        . montelukast (SINGULAIR) 10 MG tablet Take 10 mg by mouth daily.        Marland Kitchen omeprazole (PRILOSEC) 40 MG capsule Take 40 mg by mouth daily.        . potassium chloride SA (K-DUR,KLOR-CON) 20 MEQ tablet Take 20 mEq by mouth daily.        Marland Kitchen  roflumilast (DALIRESP) 500 MCG TABS tablet Take 500 mcg by mouth daily.        Marland Kitchen spironolactone (ALDACTONE) 25 MG tablet Take 25 mg by mouth daily.        . theophylline (THEO-24) 300 MG 24 hr capsule Take 300 mg by mouth daily.        Marland Kitchen tiotropium (SPIRIVA HANDIHALER) 18 MCG inhalation capsule Place 18 mcg into inhaler and inhale as needed.           Review of Systems  Constitutional: Negative.   HENT: Negative.   Eyes: Negative.   Respiratory: Positive for shortness of breath.   Cardiovascular: Negative.   Gastrointestinal: Negative.   Musculoskeletal: Positive for arthralgias and gait problem.  Skin: Negative.   Neurological: Negative.   Hematological: Negative.   Psychiatric/Behavioral: Negative.   All other systems reviewed and are negative.    BP 130/82  Pulse 103   Ht 5\' 9"  (1.753 m)  Wt 192 lb 4 oz (87.204 kg)  BMI 28.39 kg/m2  SpO2 97%   Physical Exam  Nursing note and vitals reviewed. Constitutional: He is oriented to person, place, and time. He appears well-developed and well-nourished.       Sitting in a wheel chair, appears comfortable with 2 L nasal cannula  HENT:  Head: Normocephalic.  Nose: Nose normal.  Mouth/Throat: Oropharynx is clear and moist.  Eyes: Conjunctivae are normal. Pupils are equal, round, and reactive to light.  Neck: Normal range of motion. Neck supple. No JVD present.  Cardiovascular: Normal rate, regular rhythm, S1 normal, S2 normal, normal heart sounds and intact distal pulses.  Exam reveals no gallop and no friction rub.   No murmur heard. Pulmonary/Chest: No respiratory distress. He has decreased breath sounds. He has wheezes. He has rales. He exhibits no tenderness.  Abdominal: Soft. Bowel sounds are normal. He exhibits no distension. There is no tenderness.  Musculoskeletal: Normal range of motion. He exhibits no edema and no tenderness.  Lymphadenopathy:    He has no cervical adenopathy.  Neurological: He is alert and oriented to person, place, and time. Coordination normal.  Skin: Skin is warm and dry. No rash noted. No erythema.  Psychiatric: He has a normal mood and affect. His behavior is normal. Judgment and thought content normal.           Assessment and Plan

## 2010-10-25 NOTE — Assessment & Plan Note (Signed)
Continue lipitor Goal LDL <70 

## 2010-10-25 NOTE — Assessment & Plan Note (Signed)
Shortness of breath is currently stable on oxygen. Recent bronchitis exacerbation has improved.

## 2010-10-25 NOTE — Telephone Encounter (Signed)
Pt.notified

## 2010-10-25 NOTE — Assessment & Plan Note (Signed)
Moderate disease as seen on cardiac catheterization. Would continue Lipitor, aspirin. Goal LDL less than 70.

## 2010-10-26 ENCOUNTER — Telehealth: Payer: Self-pay | Admitting: *Deleted

## 2010-10-26 NOTE — Telephone Encounter (Signed)
Spoke to Rosey Bath, Charity fundraiser at [employer] regarding home health for pt regarding polypharmacy. They have actually initiated home health with [facility] recently last month and this should be followed, FYI. Thanks.

## 2010-12-30 ENCOUNTER — Inpatient Hospital Stay: Payer: Medicare Other | Admitting: Internal Medicine

## 2011-04-04 ENCOUNTER — Ambulatory Visit: Payer: Medicare Other | Admitting: Cardiovascular Disease

## 2011-04-20 ENCOUNTER — Ambulatory Visit (INDEPENDENT_AMBULATORY_CARE_PROVIDER_SITE_OTHER): Payer: Medicare Other | Admitting: Cardiovascular Disease

## 2011-04-20 ENCOUNTER — Encounter: Payer: Self-pay | Admitting: Cardiovascular Disease

## 2011-04-20 VITALS — BP 110/64 | HR 68 | Ht 69.0 in | Wt 207.0 lb

## 2011-04-20 DIAGNOSIS — F17200 Nicotine dependence, unspecified, uncomplicated: Secondary | ICD-10-CM

## 2011-04-20 DIAGNOSIS — I2510 Atherosclerotic heart disease of native coronary artery without angina pectoris: Secondary | ICD-10-CM

## 2011-04-20 DIAGNOSIS — R609 Edema, unspecified: Secondary | ICD-10-CM | POA: Insufficient documentation

## 2011-04-20 DIAGNOSIS — E785 Hyperlipidemia, unspecified: Secondary | ICD-10-CM

## 2011-04-20 DIAGNOSIS — J449 Chronic obstructive pulmonary disease, unspecified: Secondary | ICD-10-CM

## 2011-04-20 DIAGNOSIS — R0602 Shortness of breath: Secondary | ICD-10-CM

## 2011-04-20 MED ORDER — FUROSEMIDE 40 MG PO TABS: 80.0000 mg | ORAL_TABLET | Freq: Every day | ORAL | Status: DC

## 2011-04-20 NOTE — Assessment & Plan Note (Signed)
We have encouraged him to continue to work on weaning his cigarettes and smoking cessation. He will continue to work on this and does not want any assistance with chantix.  

## 2011-04-20 NOTE — Assessment & Plan Note (Signed)
Currently with no symptoms of angina. No further workup at this time. Continue current medication regimen. 

## 2011-04-20 NOTE — Assessment & Plan Note (Signed)
He does have pulmonary hypertension and diastolic dysfunction. We have recommended given his increased weight gain of 15 pounds that he take extra Lasix at noon with goal weight less than 200.

## 2011-04-20 NOTE — Assessment & Plan Note (Signed)
Goal LDL less than 70. Continue statin 

## 2011-04-20 NOTE — Assessment & Plan Note (Signed)
Severe underlying COPD. Appears relatively stable recently with no recent URI or bronchitis.

## 2011-04-20 NOTE — Patient Instructions (Addendum)
You are doing well. Please take extra lasix in the afternoon for edema or swelling, or increased shortness of breath  Try lasix 80 mg in the morning Goal weight less than 200 lbs  Please call us if you have new issues that need to be addressed before your next appt.  Your physician wants you to follow-up in: 3 months.  You will receive a reminder letter in the mail two months in advance. If you don't receive a letter, please call our office to schedule the follow-up appointment.

## 2011-04-20 NOTE — Assessment & Plan Note (Signed)
Secondary to her hypertension and diastolic dysfunction. We have recommended he increase his diuretic and take extra dose at noon with goal weight less than 200. He has had 15 pound weight gain over the past 6 months.

## 2011-04-20 NOTE — Progress Notes (Signed)
Patient ID: [name], male    DOB: 1940/12/08, 71 y.o.   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   hospitalization at [facility] last year.  Cardiac catheterization was performed as he was unable to tolerate any stress testing.  Cardiac catheterization performed October 11 2010 showed mild proximal left circumflex disease, moderate mid left circumflex disease, mild proximal LAD disease, ejection fraction 45-50%, severe right internal iliac arterial disease ( 30% diffuse proximal LAD disease, 40% proximal left circumflex, 50% diffuse mid left circumflex after the OM 2) Medical management was recommended  Since his last visit, his weight is up 15 pounds. Previous weight 192 pounds, current weight 207 pounds. He has trace edema. He continues to have cramps in his chest for which he takes extra salt. He is on Lasix 60 mg in the morning. He does not watch his weight and has not take extra diuretic as needed  EKG shows sinus tachycardia with rate 78 beats per minute, right bundle branch block, left axis deviation     Outpatient Encounter Prescriptions as of 70/04/2011  Medication Sig Dispense Refill  . albuterol (PROVENTIL HFA;VENTOLIN HFA) 108 (90 BASE) MCG/ACT inhaler Inhale 2 puffs into the lungs every 6 (six) hours as needed.      Marland Kitchen albuterol (PROVENTIL) (2.5 MG/3ML) 0.083% nebulizer solution Take 2.5 mg by nebulization as needed.        . ALPRAZolam (XANAX) 0.25 MG tablet Take 0.25 mg by mouth daily.        Marland Kitchen aspirin (ASPIR-81) 81 MG EC tablet Take 81 mg by mouth daily.        Marland Kitchen atorvastatin (LIPITOR) 20 MG tablet Take 20 mg by mouth daily.       . chlorzoxazone (PARAFON) 500 MG tablet Take 500 mg by mouth 2 (two) times daily. As needed for muscle spasm       . cyclobenzaprine (FLEXERIL) 10 MG tablet Take 10 mg by mouth 3 (three) times daily as needed.      . ergocalciferol (VITAMIN D2) 50000 UNITS capsule Take 50,000 Units by mouth once a week.      . furosemide (LASIX) 40 MG tablet Take 2 tablets (80 mg total) by mouth daily. Take one and one half tablet daily.  180 tablet  6  . guaiFENesin (MUCINEX) 600 MG 12 hr tablet Take 1,200 mg by mouth 2 (two) times daily.      Marland Kitchen HYDROcodone-acetaminophen (VICODIN) 5-500 MG per tablet Take 1 tablet by mouth every 6 (six) hours as needed.      Marland Kitchen ipratropium-albuterol (DUONEB) 0.5-2.5 (3) MG/3ML SOLN Take 3 mLs by nebulization as needed.        Marland Kitchen lisinopril (PRINIVIL,ZESTRIL) 10 MG tablet Take 10 mg by mouth 2 (two) times daily.      . Meth-Hyo-M Bl-Na Phos-Ph Sal (URIBEL PO) Take by mouth 4 (four) times daily.        . nebivolol (BYSTOLIC) 5 MG tablet Take 1 tablet (5 mg total) by mouth daily.  30 tablet  6  . NON FORMULARY Oxygen 2 liters at rest. 4 liters with walking.      Marland Kitchen omeprazole (PRILOSEC) 40 MG capsule  Take 40 mg by mouth daily.        . polyethylene glycol powder (MIRALAX) powder Take 17 g by mouth daily.      . potassium chloride SA (K-DUR,KLOR-CON) 20 MEQ tablet Take 20 mEq by mouth daily.        . roflumilast (DALIRESP) 500 MCG TABS tablet Take 500 mcg by mouth daily.        . silodosin (RAPAFLO) 4 MG CAPS capsule Take 8 mg by mouth daily with breakfast.      . spironolactone (ALDACTONE) 25 MG tablet Take 25 mg by mouth daily.        . theophylline (THEO-24) 300 MG 24 hr capsule Take 300 mg by mouth daily.        Marland Kitchen tiotropium (SPIRIVA HANDIHALER) 18 MCG inhalation capsule Place 18 mcg into inhaler and inhale as needed.          Review of Systems  Constitutional: Negative.   HENT: Negative.   Eyes: Negative.   Respiratory: Positive for shortness of breath.     Cardiovascular: Negative.   Gastrointestinal: Negative.   Musculoskeletal: Positive for gait problem.  Skin: Negative.   Neurological: Negative.   Hematological: Negative.   Psychiatric/Behavioral: Negative.   All other systems reviewed and are negative.    BP 110/64  Pulse 68  Ht 5\' 9"  (1.753 m)  Wt 207 lb (93.895 kg)  BMI 30.57 kg/m2   Physical Exam  Nursing note and vitals reviewed. Constitutional: He is oriented to person, place, and time. He appears well-developed and well-nourished.       Sitting in a wheel chair, appears comfortable with 2 L nasal cannula  HENT:  Head: Normocephalic.  Nose: Nose normal.  Mouth/Throat: Oropharynx is clear and moist.  Eyes: Conjunctivae are normal. Pupils are equal, round, and reactive to light.  Neck: Normal range of motion. Neck supple. No JVD present.  Cardiovascular: Normal rate, regular rhythm, S1 normal, S2 normal, normal heart sounds and intact distal pulses.  Exam reveals no gallop and no friction rub.   No murmur heard. Pulmonary/Chest: Effort normal. No respiratory distress. He has decreased breath sounds. He has wheezes. He has rales. He exhibits no tenderness.  Abdominal: Soft. Bowel sounds are normal. He exhibits no distension. There is no tenderness.  Musculoskeletal: Normal range of motion. He exhibits no edema and no tenderness.  Lymphadenopathy:    He has no cervical adenopathy.  Neurological: He is alert and oriented to person, place, and time. Coordination normal.  Skin: Skin is warm and dry. No rash noted. No erythema.  Psychiatric: He has a normal mood and affect. His behavior is normal. Judgment and thought content normal.           Assessment and Plan

## 2011-05-08 ENCOUNTER — Ambulatory Visit: Payer: Self-pay | Admitting: Pain Medicine

## 2011-05-31 ENCOUNTER — Ambulatory Visit: Payer: Self-pay | Admitting: Pain Medicine

## 2011-06-27 ENCOUNTER — Ambulatory Visit: Payer: Self-pay | Admitting: Pain Medicine

## 2011-07-19 ENCOUNTER — Ambulatory Visit (INDEPENDENT_AMBULATORY_CARE_PROVIDER_SITE_OTHER): Payer: Medicare Other | Admitting: Cardiovascular Disease

## 2011-07-19 ENCOUNTER — Encounter: Payer: Self-pay | Admitting: Cardiovascular Disease

## 2011-07-19 VITALS — BP 120/64 | HR 93 | Ht 69.0 in | Wt 209.0 lb

## 2011-07-19 DIAGNOSIS — J449 Chronic obstructive pulmonary disease, unspecified: Secondary | ICD-10-CM

## 2011-07-19 DIAGNOSIS — R609 Edema, unspecified: Secondary | ICD-10-CM

## 2011-07-19 DIAGNOSIS — I5032 Chronic diastolic (congestive) heart failure: Secondary | ICD-10-CM

## 2011-07-19 DIAGNOSIS — R079 Chest pain, unspecified: Secondary | ICD-10-CM

## 2011-07-19 DIAGNOSIS — I509 Heart failure, unspecified: Secondary | ICD-10-CM

## 2011-07-19 DIAGNOSIS — I2510 Atherosclerotic heart disease of native coronary artery without angina pectoris: Secondary | ICD-10-CM

## 2011-07-19 DIAGNOSIS — R062 Wheezing: Secondary | ICD-10-CM

## 2011-07-19 DIAGNOSIS — F17200 Nicotine dependence, unspecified, uncomplicated: Secondary | ICD-10-CM

## 2011-07-19 MED ORDER — TORSEMIDE 20 MG PO TABS: 40.0000 mg | ORAL_TABLET | Freq: Two times a day (BID) | ORAL | Status: DC

## 2011-07-19 MED ORDER — AZITHROMYCIN 250 MG PO TABS: ORAL_TABLET | ORAL | Status: AC

## 2011-07-19 MED ORDER — PREDNISONE 20 MG PO TABS: 20.0000 mg | ORAL_TABLET | Freq: Every day | ORAL | Status: AC

## 2011-07-19 NOTE — Progress Notes (Signed)
Patient ID: [name], male    DOB: 06-11-1940, 71 y.o.   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   hospitalization at [facility] last year.  Cardiac catheterization was performed as he was unable to tolerate any stress testing.  Cardiac catheterization performed October 11 2010 showed mild proximal left circumflex disease, moderate mid left circumflex disease, mild proximal LAD disease, ejection fraction 45-50%, severe right internal iliac arterial disease ( 30% diffuse proximal LAD disease, 40% proximal left circumflex, 50% diffuse mid left circumflex after the OM 2)  Medical management was recommended  On his last clinic visit, his weight was up 15 pounds. He was urged to take extra Lasix and decrease his fluid intake for diastolic CHF.  He presents today in his weight has not improved and is even higher than before. He has worsening lower extremity edema on the left, increasing cough. His cough is thick and sometimes he is able to bring up thick secretions. He has more shortness of breath and wheezing in the past several days. He is using more oxygen, 4 L on a frequent basis at home with his generator. He reports having worsening abdominal swelling. Current weight 209 pounds. Weight on his last clinic visit 207 pounds  EKG shows sinus rhythm with frequent PVCs      Outpatient Encounter Prescriptions as of 07/19/2011  Medication Sig Dispense Refill  . albuterol (PROVENTIL HFA;VENTOLIN HFA) 108 (90 BASE) MCG/ACT inhaler Inhale 2 puffs into the lungs every 6 (six) hours as needed.      Marland Kitchen albuterol (PROVENTIL) (2.5 MG/3ML) 0.083% nebulizer  solution Take 2.5 mg by nebulization as needed.        . ALPRAZolam (XANAX) 0.25 MG tablet Take 0.25 mg by mouth daily.        Marland Kitchen aspirin (ASPIR-81) 81 MG EC tablet Take 81 mg by mouth daily.        Marland Kitchen atorvastatin (LIPITOR) 20 MG tablet Take 20 mg by mouth daily.      . chlorzoxazone (PARAFON) 500 MG tablet Take 500 mg by mouth 2 (two) times daily. As needed for muscle spasm       . cyclobenzaprine (FLEXERIL) 10 MG tablet Take 10 mg by mouth 3 (three) times daily as needed.      . ergocalciferol (VITAMIN D2) 50000 UNITS capsule Take 50,000 Units by mouth once a week.      Marland Kitchen guaiFENesin (MUCINEX) 600 MG 12 hr tablet Take 1,200 mg by mouth 2 (two) times daily.      Marland Kitchen HYDROcodone-acetaminophen (VICODIN) 5-500 MG per tablet Take 1 tablet by mouth every 6 (six) hours as needed.      Marland Kitchen ipratropium-albuterol (DUONEB) 0.5-2.5 (3) MG/3ML SOLN Take 3 mLs by nebulization as needed.        Marland Kitchen lisinopril (PRINIVIL,ZESTRIL) 10 MG tablet Take 10 mg by mouth 2 (two) times daily.      . Meth-Hyo-M Bl-Na Phos-Ph Sal (URIBEL PO) Take by mouth 4 (four) times daily.        . nebivolol (BYSTOLIC) 5 MG tablet Take 1 tablet (5 mg total) by mouth daily.  30 tablet  6  . NON FORMULARY Oxygen  2 liters at rest. 4 liters with walking.      Marland Kitchen omeprazole (PRILOSEC) 40 MG capsule Take 40 mg by mouth daily.        . polyethylene glycol powder (MIRALAX) powder Take 17 g by mouth daily.      . potassium chloride SA (K-DUR,KLOR-CON) 20 MEQ tablet Take 20 mEq by mouth daily.        . roflumilast (DALIRESP) 500 MCG TABS tablet Take 500 mcg by mouth daily.        . silodosin (RAPAFLO) 4 MG CAPS capsule Take 8 mg by mouth daily with breakfast.      . spironolactone (ALDACTONE) 25 MG tablet Take 25 mg by mouth daily.        . theophylline (THEO-24) 300 MG 24 hr capsule Take 300 mg by mouth daily.        Marland Kitchen tiotropium (SPIRIVA HANDIHALER) 18 MCG inhalation capsule Place 18 mcg into inhaler and inhale as needed.        . furosemide (LASIX)  40 MG tablet Take 2 tablets (80 mg total) by mouth daily. Take one and one half tablet daily.  180 tablet  6     Review of Systems  Constitutional: Positive for unexpected weight change.  HENT: Negative.   Eyes: Negative.   Respiratory: Positive for cough, chest tightness, shortness of breath and wheezing.   Cardiovascular: Positive for leg swelling.  Gastrointestinal: Negative.   Musculoskeletal: Positive for gait problem.  Skin: Negative.   Neurological: Negative.   Hematological: Negative.   Psychiatric/Behavioral: Negative.   All other systems reviewed and are negative.    BP 120/64  Pulse 93  Ht 5\' 9"  (1.753 m)  Wt 209 lb (94.802 kg)  BMI 30.86 kg/m2  SpO2 85%  Physical Exam  Nursing note and vitals reviewed. Constitutional: He is oriented to person, place, and time. He appears well-developed and well-nourished.  HENT:  Head: Normocephalic.  Nose: Nose normal.  Mouth/Throat: Oropharynx is clear and moist.  Eyes: Conjunctivae are normal. Pupils are equal, round, and reactive to light.  Neck: Normal range of motion. Neck supple. No JVD present.  Cardiovascular: Normal rate, regular rhythm, S1 normal, S2 normal, normal heart sounds and intact distal pulses.  Exam reveals no gallop and no friction rub.   No murmur heard. Pulmonary/Chest: Effort normal. No respiratory distress. He has decreased breath sounds. He has wheezes. He has rales. He exhibits no tenderness.  Abdominal: Soft. Bowel sounds are normal. He exhibits no distension. There is no tenderness.  Musculoskeletal: Normal range of motion. He exhibits no edema and no tenderness.  Lymphadenopathy:    He has no cervical adenopathy.  Neurological: He is alert and oriented to person, place, and time. Coordination normal.  Skin: Skin is warm and dry. No rash noted. No erythema.  Psychiatric: He has a normal mood and affect. His behavior is normal. Judgment and thought content normal.           Assessment and  Plan

## 2011-07-19 NOTE — Assessment & Plan Note (Signed)
Significant symptoms for chronic bronchitis with profound wheezing on exam today. We will start him on prednisone taper and Z-Pak. He has followup with [facility] in several weeks.

## 2011-07-19 NOTE — Assessment & Plan Note (Signed)
Multifactorial diastolic CHF including severe underlying lung disease. No significant improvement in his symptoms or weight with increased Lasix. We have offered torsemide 40 mg twice a day instead of Lasix. Goal weight less than 200 pounds

## 2011-07-19 NOTE — Assessment & Plan Note (Signed)
We have recommended smoking cessation.

## 2011-07-19 NOTE — Assessment & Plan Note (Signed)
Aggressive treatment as above for suspected bronchitis.

## 2011-07-19 NOTE — Patient Instructions (Addendum)
You are doing well. Hold the lasix Start torsemide 40 mg twice a day (two pills twice a day) Monitor your weight Goal weight less than 200 lbs  Start prednisone taper 60 mg x 3 days, then 40 mg x 3 days, then 20 mg 3 days, then 10 mg x 3 days  Take azithromycin two pills today then one pill for additional 4 days  Please call us if you have new issues that need to be addressed before your next appt.  Your physician wants you to follow-up in: 3 months.  You will receive a reminder letter in the mail two months in advance. If you don't receive a letter, please call our office to schedule the follow-up appointment.

## 2011-07-19 NOTE — Assessment & Plan Note (Signed)
Currently with no symptoms of angina. No further workup at this time. Continue current medication regimen. 

## 2011-07-19 NOTE — Assessment & Plan Note (Signed)
Large terminating a notable on the left in particular likely from underlying pulmonary hypertension.

## 2011-07-31 ENCOUNTER — Ambulatory Visit: Payer: Self-pay | Admitting: Pain Medicine

## 2011-08-23 ENCOUNTER — Inpatient Hospital Stay: Payer: Self-pay | Admitting: Internal Medicine

## 2011-08-23 LAB — CBC WITH DIFFERENTIAL/PLATELET
Basophil #: 0.1 10*3/uL (ref 0.0–0.1)
Basophil %: 0.6 %
Eosinophil %: 2.4 %
HCT: 40.8 % (ref 40.0–52.0)
HGB: 13.6 g/dL (ref 13.0–18.0)
Lymphocyte #: 1.6 10*3/uL (ref 1.0–3.6)
MCH: 30.7 pg (ref 26.0–34.0)
MCV: 92 fL (ref 80–100)
Monocyte #: 0.8 x10 3/mm (ref 0.2–1.0)
Neutrophil #: 8.4 10*3/uL — ABNORMAL HIGH (ref 1.4–6.5)
Platelet: 149 10*3/uL — ABNORMAL LOW (ref 150–440)
RBC: 4.43 10*6/uL (ref 4.40–5.90)
WBC: 11.1 10*3/uL — ABNORMAL HIGH (ref 3.8–10.6)

## 2011-08-23 LAB — URINALYSIS, COMPLETE
Glucose,UR: NEGATIVE mg/dL (ref 0–75)
Ketone: NEGATIVE
Nitrite: NEGATIVE
Protein: NEGATIVE
RBC,UR: <1 /HPF (ref 0–5)
WBC UR: <1 /HPF (ref 0–5)

## 2011-08-23 LAB — COMPREHENSIVE METABOLIC PANEL
Albumin: 3.4 g/dL (ref 3.4–5.0)
Alkaline Phosphatase: 79 U/L (ref 50–136)
Anion Gap: 9 (ref 7–16)
Calcium, Total: 9.2 mg/dL (ref 8.5–10.1)
Co2: 31 mmol/L (ref 21–32)
EGFR (Non-African Amer.): 30 — ABNORMAL LOW
Glucose: 95 mg/dL (ref 65–99)
SGPT (ALT): 14 U/L (ref 12–78)

## 2011-08-24 LAB — CBC WITH DIFFERENTIAL/PLATELET
Basophil #: 0 10*3/uL (ref 0.0–0.1)
Basophil %: 0.2 %
Eosinophil %: 0 %
HGB: 12.3 g/dL — ABNORMAL LOW (ref 13.0–18.0)
MCH: 30.9 pg (ref 26.0–34.0)
MCHC: 33.3 g/dL (ref 32.0–36.0)
MCV: 93 fL (ref 80–100)
Neutrophil #: 10.3 10*3/uL — ABNORMAL HIGH (ref 1.4–6.5)
RBC: 3.98 10*6/uL — ABNORMAL LOW (ref 4.40–5.90)
RDW: 17.8 % — ABNORMAL HIGH (ref 11.5–14.5)

## 2011-08-24 LAB — BASIC METABOLIC PANEL
Anion Gap: 7 (ref 7–16)
BUN: 32 mg/dL — ABNORMAL HIGH (ref 7–18)
Chloride: 106 mmol/L (ref 98–107)
Creatinine: 1.54 mg/dL — ABNORMAL HIGH (ref 0.60–1.30)
EGFR (African American): 53 — ABNORMAL LOW
EGFR (Non-African Amer.): 45 — ABNORMAL LOW
Glucose: 220 mg/dL — ABNORMAL HIGH (ref 65–99)
Osmolality: 295 (ref 275–301)
Potassium: 4.9 mmol/L (ref 3.5–5.1)
Sodium: 141 mmol/L (ref 136–145)

## 2011-08-24 LAB — MAGNESIUM: Magnesium: 2.2 mg/dL

## 2011-08-25 LAB — BASIC METABOLIC PANEL
Anion Gap: 8 (ref 7–16)
BUN: 27 mg/dL — ABNORMAL HIGH (ref 7–18)
Calcium, Total: 9.2 mg/dL (ref 8.5–10.1)
Chloride: 107 mmol/L (ref 98–107)
Co2: 29 mmol/L (ref 21–32)
Creatinine: 1.49 mg/dL — ABNORMAL HIGH (ref 0.60–1.30)
EGFR (African American): 55 — ABNORMAL LOW
EGFR (Non-African Amer.): 47 — ABNORMAL LOW
Glucose: 210 mg/dL — ABNORMAL HIGH (ref 65–99)
Osmolality: 298 (ref 275–301)
Potassium: 4.8 mmol/L (ref 3.5–5.1)
Sodium: 144 mmol/L (ref 136–145)

## 2011-08-25 LAB — CBC WITH DIFFERENTIAL/PLATELET
Basophil #: 0 10*3/uL
Basophil %: 0.2 %
Eosinophil #: 0 10*3/uL
Eosinophil %: 0 %
HCT: 37.6 % — ABNORMAL LOW
HGB: 12 g/dL — ABNORMAL LOW
Lymphocyte %: 3.9 %
Lymphs Abs: 0.8 10*3/uL — ABNORMAL LOW
MCH: 30 pg
MCHC: 31.9 g/dL — ABNORMAL LOW
MCV: 94 fL
Monocyte #: 0.5 10*3/uL
Monocyte %: 2.4 %
Neutrophil #: 19.4 10*3/uL — ABNORMAL HIGH
Neutrophil %: 93.5 %
Platelet: 148 10*3/uL — ABNORMAL LOW
RBC: 4 x10 6/mm 3 — ABNORMAL LOW
RDW: 18.1 % — ABNORMAL HIGH
WBC: 20.7 10*3/uL — ABNORMAL HIGH

## 2011-08-27 LAB — BASIC METABOLIC PANEL
BUN: 26 mg/dL — ABNORMAL HIGH (ref 7–18)
Chloride: 105 mmol/L (ref 98–107)
Co2: 31 mmol/L (ref 21–32)
Creatinine: 1.46 mg/dL — ABNORMAL HIGH (ref 0.60–1.30)
EGFR (Non-African Amer.): 48 — ABNORMAL LOW
Glucose: 164 mg/dL — ABNORMAL HIGH (ref 65–99)
Osmolality: 295 (ref 275–301)
Potassium: 4.4 mmol/L (ref 3.5–5.1)
Sodium: 144 mmol/L (ref 136–145)

## 2011-08-27 LAB — CBC WITH DIFFERENTIAL/PLATELET
Eosinophil %: 0 %
HCT: 38.1 % — ABNORMAL LOW (ref 40.0–52.0)
Lymphocyte %: 5.1 %
MCHC: 32.5 g/dL (ref 32.0–36.0)
Monocyte #: 0.6 x10 3/mm (ref 0.2–1.0)
Monocyte %: 3.4 %
Neutrophil #: 15.4 10*3/uL — ABNORMAL HIGH (ref 1.4–6.5)
Neutrophil %: 91.4 %
Platelet: 155 10*3/uL (ref 150–440)
WBC: 16.8 10*3/uL — ABNORMAL HIGH (ref 3.8–10.6)

## 2011-08-29 LAB — BASIC METABOLIC PANEL
BUN: 32 mg/dL — ABNORMAL HIGH (ref 7–18)
Calcium, Total: 8.5 mg/dL (ref 8.5–10.1)
Chloride: 101 mmol/L (ref 98–107)
Co2: 30 mmol/L (ref 21–32)
Creatinine: 1.54 mg/dL — ABNORMAL HIGH (ref 0.60–1.30)
EGFR (African American): 53 — ABNORMAL LOW
Sodium: 140 mmol/L (ref 136–145)

## 2011-08-29 LAB — CULTURE, BLOOD (SINGLE)

## 2011-08-30 LAB — BASIC METABOLIC PANEL
Anion Gap: 10 (ref 7–16)
BUN: 36 mg/dL — ABNORMAL HIGH (ref 7–18)
Chloride: 99 mmol/L (ref 98–107)
Creatinine: 1.44 mg/dL — ABNORMAL HIGH (ref 0.60–1.30)
EGFR (African American): 57 — ABNORMAL LOW
EGFR (Non-African Amer.): 49 — ABNORMAL LOW
Glucose: 202 mg/dL — ABNORMAL HIGH (ref 65–99)
Osmolality: 293 (ref 275–301)
Potassium: 4.1 mmol/L (ref 3.5–5.1)
Sodium: 140 mmol/L (ref 136–145)

## 2011-09-19 ENCOUNTER — Other Ambulatory Visit: Payer: Self-pay

## 2011-09-19 MED ORDER — ATORVASTATIN CALCIUM 20 MG PO TABS: 20.0000 mg | ORAL_TABLET | Freq: Every day | ORAL | Status: DC

## 2011-09-19 NOTE — Telephone Encounter (Signed)
Refill sent for atorvastatin 20 mg  

## 2011-11-07 ENCOUNTER — Other Ambulatory Visit: Payer: Self-pay | Admitting: Cardiovascular Disease

## 2011-11-07 MED ORDER — TORSEMIDE 20 MG PO TABS: 40.0000 mg | ORAL_TABLET | Freq: Two times a day (BID) | ORAL | Status: DC

## 2011-11-07 NOTE — Telephone Encounter (Signed)
Pt has only been taking 20 mg twice a day. Suppose to be 40 mg twice daily needs new refill called in.

## 2011-11-28 ENCOUNTER — Ambulatory Visit: Payer: Medicare Other | Admitting: Cardiovascular Disease

## 2011-12-04 ENCOUNTER — Ambulatory Visit: Payer: Medicare Other | Admitting: Cardiovascular Disease

## 2011-12-08 ENCOUNTER — Ambulatory Visit: Payer: Medicare Other | Admitting: Cardiovascular Disease

## 2011-12-12 ENCOUNTER — Ambulatory Visit: Payer: Medicare Other | Admitting: Cardiovascular Disease

## 2011-12-18 ENCOUNTER — Encounter: Payer: Self-pay | Admitting: Cardiovascular Disease

## 2011-12-18 ENCOUNTER — Ambulatory Visit (INDEPENDENT_AMBULATORY_CARE_PROVIDER_SITE_OTHER): Payer: Medicare Other | Admitting: Cardiovascular Disease

## 2011-12-18 VITALS — BP 130/70 | HR 118 | Ht 69.0 in | Wt 200.0 lb

## 2011-12-18 DIAGNOSIS — I509 Heart failure, unspecified: Secondary | ICD-10-CM

## 2011-12-18 DIAGNOSIS — I5032 Chronic diastolic (congestive) heart failure: Secondary | ICD-10-CM

## 2011-12-18 DIAGNOSIS — I2510 Atherosclerotic heart disease of native coronary artery without angina pectoris: Secondary | ICD-10-CM

## 2011-12-18 DIAGNOSIS — R0602 Shortness of breath: Secondary | ICD-10-CM

## 2011-12-18 DIAGNOSIS — R000 Tachycardia, unspecified: Secondary | ICD-10-CM

## 2011-12-18 DIAGNOSIS — J449 Chronic obstructive pulmonary disease, unspecified: Secondary | ICD-10-CM

## 2011-12-18 DIAGNOSIS — R609 Edema, unspecified: Secondary | ICD-10-CM

## 2011-12-18 NOTE — Assessment & Plan Note (Signed)
Currently with no symptoms of angina. No further workup at this time. Continue current medication regimen. 

## 2011-12-18 NOTE — Assessment & Plan Note (Signed)
Sinus tachycardia from underlying pulmonary disease. Albuterol will also exacerbate his rate. We have suggested he call for followup with Dr. Welton Flakes for further pulmonary evaluation. We will hold off on increasing his beta blocker.

## 2011-12-18 NOTE — Patient Instructions (Addendum)
You are doing well. No medication changes were made. It is ok to hold the lasix periodically as needed for dry mouth/throat  Please call us if you have new issues that need to be addressed before your next appt.  Your physician wants you to follow-up in: 6 months.  You will receive a reminder letter in the mail two months in advance. If you don't receive a letter, please call our office to schedule the follow-up appointment.

## 2011-12-18 NOTE — Assessment & Plan Note (Signed)
No edema on today's visit. Very wheezy consistent with COPD exacerbation. I'm concerned about overdiuresis and we have suggested that he have lab checked with primary care physician in several weeks. Okay to skip dose of diuretic periodically.

## 2011-12-18 NOTE — Progress Notes (Signed)
Patient ID: [name], male    DOB: 01/05/1941, 71 y.o.   MRN: [id number]  HPI Comments: 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   hospitalization at [facility] last year.  Cardiac catheterization was performed as he was unable to tolerate any stress testing.  Cardiac catheterization performed October 11 2010 showed mild proximal left circumflex disease, moderate mid left circumflex disease, mild proximal LAD disease, ejection fraction 45-50%, severe right internal iliac arterial disease ( 30% diffuse proximal LAD disease, 40% proximal left circumflex, 50% diffuse mid left circumflex after the OM 2)  Medical management was recommended   Previous weight gain requiring extra Lasix  for diastolic CHF.  He had worsening lower extremity edema, increasing cough. He was using more oxygen, 4 L on a frequent basis at home with his generator. He reports having worsening abdominal swelling.  Admission to the hospital in August 2013 for worsening shortness of breath, wheezing and cough. Blood pressure was low. He was started on high-dose prednisone, antibiotics with improvement. He did have diastolic CHF and was treated with Lasix.  He presents today and reports having recently seen Dr. Welton Flakes, pulmonary at [facility]. He was given prednisone taper and antibiotics. He reports that despite this treatment, he continues to have thick productive sputum that is catching in his throat and difficult to cough up, significant wheezing despite nebulizer treatments. He denies any lower extremity edema or weight change.  EKG shows sinus tachycardia with rate 118 beats per minute. APCs noted.     Outpatient Encounter Prescriptions as of  12/18/2011  Medication Sig Dispense Refill  . albuterol (PROVENTIL HFA;VENTOLIN HFA) 108 (90 BASE) MCG/ACT inhaler Inhale 2 puffs into the lungs every 6 (six) hours as needed.      Marland Kitchen albuterol (PROVENTIL) (2.5 MG/3ML) 0.083% nebulizer solution Take 2.5 mg by nebulization as needed.        . ALPRAZolam (XANAX) 0.25 MG tablet Take 0.25 mg by mouth daily.        Marland Kitchen aspirin (ASPIR-81) 81 MG EC tablet Take 81 mg by mouth daily.        Marland Kitchen atorvastatin (LIPITOR) 20 MG tablet Take 1 tablet (20 mg total) by mouth daily.  30 tablet  6  . chlorzoxazone (PARAFON) 500 MG tablet Take 500 mg by mouth 2 (two) times daily. As needed for muscle spasm       . cyclobenzaprine (FLEXERIL) 10 MG tablet Take 10 mg by mouth 3 (three) times daily as needed.      . ergocalciferol (VITAMIN D2) 50000 UNITS capsule Take 50,000 Units by mouth once a week.      . furosemide (LASIX) 40 MG tablet Take 40 mg by mouth 2 (two) times daily.      Marland Kitchen HYDROcodone-acetaminophen (VICODIN) 5-500 MG per tablet Take 1 tablet by mouth every 6 (six) hours as needed.      Marland Kitchen ipratropium-albuterol (DUONEB) 0.5-2.5 (3) MG/3ML SOLN Take 3 mLs by nebulization as needed.        Marland Kitchen lisinopril (PRINIVIL,ZESTRIL) 10 MG tablet Take 10 mg by mouth 2 (two) times daily.      . Meth-Hyo-M Bl-Na Phos-Ph Sal (URIBEL PO) Take by mouth 4 (four) times daily.        Marland Kitchen  nebivolol (BYSTOLIC) 5 MG tablet Take 1 tablet (5 mg total) by mouth daily.  30 tablet  6  . NON FORMULARY Oxygen 2 liters at rest. 4 liters with walking.      Marland Kitchen omeprazole (PRILOSEC) 40 MG capsule Take 40 mg by mouth daily.        . polyethylene glycol powder (MIRALAX) powder Take 17 g by mouth daily.      . potassium chloride SA (K-DUR,KLOR-CON) 20 MEQ tablet Take 20 mEq by mouth daily.        . roflumilast (DALIRESP) 500 MCG TABS tablet Take 500 mcg by mouth daily.        . silodosin (RAPAFLO) 4 MG CAPS capsule Take 8 mg by mouth daily with breakfast.      . spironolactone (ALDACTONE) 25 MG tablet  Take 25 mg by mouth daily.        . theophylline (THEO-24) 300 MG 24 hr capsule Take 300 mg by mouth daily.        Marland Kitchen tiotropium (SPIRIVA HANDIHALER) 18 MCG inhalation capsule Place 18 mcg into inhaler and inhale as needed.        Marland Kitchen guaiFENesin (MUCINEX) 600 MG 12 hr tablet Take 1,200 mg by mouth 2 (two) times daily.         Review of Systems  HENT: Negative.   Eyes: Negative.   Respiratory: Positive for cough, shortness of breath and wheezing.   Gastrointestinal: Negative.   Musculoskeletal: Positive for gait problem.  Skin: Negative.   Neurological: Negative.   Hematological: Negative.   Psychiatric/Behavioral: Negative.   All other systems reviewed and are negative.    BP 130/70  Pulse 118  Ht 5\' 9"  (1.753 m)  Wt 200 lb (90.719 kg)  BMI 29.53 kg/m2  Physical Exam  Nursing note and vitals reviewed. Constitutional: He is oriented to person, place, and time. He appears well-developed and well-nourished.  HENT:  Head: Normocephalic.  Nose: Nose normal.  Mouth/Throat: Oropharynx is clear and moist.  Eyes: Conjunctivae normal are normal. Pupils are equal, round, and reactive to light.  Neck: Normal range of motion. Neck supple. No JVD present.  Cardiovascular: Normal rate, regular rhythm, S1 normal, S2 normal, normal heart sounds and intact distal pulses.  Exam reveals no gallop and no friction rub.   No murmur heard. Pulmonary/Chest: Effort normal. No respiratory distress. He has decreased breath sounds. He has wheezes. He has rales. He exhibits no tenderness.  Abdominal: Soft. Bowel sounds are normal. He exhibits no distension. There is no tenderness.  Musculoskeletal: Normal range of motion. He exhibits no edema and no tenderness.  Lymphadenopathy:    He has no cervical adenopathy.  Neurological: He is alert and oriented to person, place, and time. Coordination normal.  Skin: Skin is warm and dry. No rash noted. No erythema.  Psychiatric: He has a normal mood and affect.  His behavior is normal. Judgment and thought content normal.           Assessment and Plan

## 2011-12-18 NOTE — Assessment & Plan Note (Signed)
End-stage COPD. Appears to be very wheezy and short of breath with no significant improvement after prednisone and antibiotics 10 days ago. He may benefit from alternate antibiotic, possibly a long taper of steroids. He may require chronic low-dose steroids given the severity of his symptoms.

## 2011-12-18 NOTE — Assessment & Plan Note (Signed)
No edema today concerning for possible CHF.

## 2012-01-11 ENCOUNTER — Ambulatory Visit: Payer: Self-pay

## 2012-01-11 DIAGNOSIS — I499 Cardiac arrhythmia, unspecified: Secondary | ICD-10-CM

## 2012-02-15 ENCOUNTER — Other Ambulatory Visit: Payer: Self-pay

## 2012-02-15 MED ORDER — FUROSEMIDE 40 MG PO TABS: 40.0000 mg | ORAL_TABLET | Freq: Two times a day (BID) | ORAL | Status: DC

## 2012-02-15 NOTE — Telephone Encounter (Signed)
Refill sent for furosemide 40 mg take two tablet each am.

## 2012-03-10 LAB — CK TOTAL AND CKMB (NOT AT ARMC): CK-MB: 1.1 ng/mL (ref 0.5–3.6)

## 2012-03-10 LAB — COMPREHENSIVE METABOLIC PANEL
Albumin: 3.5 g/dL (ref 3.4–5.0)
Alkaline Phosphatase: 93 U/L (ref 50–136)
Anion Gap: 7 (ref 7–16)
BUN: 15 mg/dL (ref 7–18)
Bilirubin,Total: 1 mg/dL (ref 0.2–1.0)
Calcium, Total: 9.3 mg/dL (ref 8.5–10.1)
Chloride: 99 mmol/L (ref 98–107)
Creatinine: 1.02 mg/dL (ref 0.60–1.30)
EGFR (Non-African Amer.): >60
Glucose: 107 mg/dL — ABNORMAL HIGH (ref 65–99)
Osmolality: 271 (ref 275–301)
Potassium: 4.2 mmol/L (ref 3.5–5.1)
SGOT(AST): 8 U/L — ABNORMAL LOW (ref 15–37)
Sodium: 135 mmol/L — ABNORMAL LOW (ref 136–145)

## 2012-03-10 LAB — CBC WITH DIFFERENTIAL/PLATELET
Basophil #: 0.2 10*3/uL — ABNORMAL HIGH (ref 0.0–0.1)
Eosinophil #: 0.1 10*3/uL (ref 0.0–0.7)
HCT: 41.3 % (ref 40.0–52.0)
HGB: 13.2 g/dL (ref 13.0–18.0)
Lymphocyte #: 1.2 10*3/uL (ref 1.0–3.6)
Lymphocyte %: 7.4 %
MCH: 27.3 pg (ref 26.0–34.0)
MCHC: 31.8 g/dL — ABNORMAL LOW (ref 32.0–36.0)
Neutrophil #: 13.1 10*3/uL — ABNORMAL HIGH (ref 1.4–6.5)
Neutrophil %: 81.7 %
Platelet: 195 10*3/uL (ref 150–440)

## 2012-03-11 ENCOUNTER — Inpatient Hospital Stay: Payer: Self-pay | Admitting: Internal Medicine

## 2012-03-11 LAB — TROPONIN I
Troponin-I: <0.02 ng/mL
Troponin-I: <0.02 ng/mL

## 2012-03-13 LAB — BASIC METABOLIC PANEL
Anion Gap: 10 (ref 7–16)
Calcium, Total: 9.2 mg/dL (ref 8.5–10.1)
Creatinine: 1.33 mg/dL — ABNORMAL HIGH (ref 0.60–1.30)
EGFR (Non-African Amer.): 54 — ABNORMAL LOW
Glucose: 191 mg/dL — ABNORMAL HIGH (ref 65–99)
Osmolality: 289 (ref 275–301)
Sodium: 138 mmol/L (ref 136–145)

## 2012-03-14 ENCOUNTER — Other Ambulatory Visit: Payer: Self-pay | Admitting: *Deleted

## 2012-03-14 LAB — BUN: BUN: 33 mg/dL — ABNORMAL HIGH (ref 7–18)

## 2012-03-14 LAB — CREATININE, SERUM
Creatinine: 1.29 mg/dL (ref 0.60–1.30)
EGFR (African American): >60

## 2012-03-14 LAB — TSH: Thyroid Stimulating Horm: 1.04 u[IU]/mL

## 2012-03-14 MED ORDER — ATORVASTATIN CALCIUM 20 MG PO TABS: 20.0000 mg | ORAL_TABLET | Freq: Every day | ORAL | Status: DC

## 2012-03-14 NOTE — Telephone Encounter (Signed)
Refilled Atorvastatin sent to [facility].

## 2012-03-16 LAB — CULTURE, BLOOD (SINGLE)

## 2012-03-18 LAB — PLATELET COUNT: Platelet: 202 10*3/uL (ref 150–440)

## 2012-05-16 ENCOUNTER — Telehealth: Payer: Self-pay

## 2012-05-16 ENCOUNTER — Ambulatory Visit: Payer: Self-pay

## 2012-05-16 NOTE — Telephone Encounter (Signed)
Needs to know he is taking Lasix 40 mg twice a day and Torsemide twice a day, questions if he should be on both or just one. Please advise

## 2012-05-17 NOTE — Telephone Encounter (Signed)
Would stay on Lasix 40 twice a day If worsening edema or shortness of breath, would change to torsemide

## 2012-05-17 NOTE — Telephone Encounter (Signed)
See below and advise

## 2012-05-17 NOTE — Telephone Encounter (Signed)
I spoke with Erie Noe, pt's [facility] nurse Says pt gets meds pre packaged from pharmacy and realized he has been taking both lasix 40 mg BID and torsemide 20 mg 2 tablets BID Concerned and questioning if he should be taking both I advised, per last Office note, he should only be taking furosemide 40 mg BID She says she called pharmacy yesterday and they told her both RX had been sent in  I called Pharmacy and spoke with pharmacist who says they received an escribe RX 02/15/12 for both meds and have been filling these meds in pre packaged packs  I do not have record of escribe for torsemide on 02/15/12. This had been d/c I do, however have record of escribe for lasix on 02/15/12 I will confirm with Dr. Mariah Milling what pt should be taking then will inform pharmacy

## 2012-05-20 ENCOUNTER — Other Ambulatory Visit: Payer: Self-pay

## 2012-05-20 NOTE — Telephone Encounter (Signed)
lmom of [name]'s to make her aware we have taken care of this issue with pharmacist

## 2012-05-20 NOTE — Telephone Encounter (Signed)
I called and spoke with Sherrine Maples, pharmacist, and advised him of change He verb understanding and will change pt's pre filled packets

## 2012-06-18 ENCOUNTER — Ambulatory Visit: Payer: Medicare Other | Admitting: Cardiovascular Disease

## 2012-06-26 ENCOUNTER — Ambulatory Visit (INDEPENDENT_AMBULATORY_CARE_PROVIDER_SITE_OTHER): Payer: Medicare Other | Admitting: Cardiovascular Disease

## 2012-06-26 ENCOUNTER — Encounter (INDEPENDENT_AMBULATORY_CARE_PROVIDER_SITE_OTHER): Payer: Medicare Other

## 2012-06-26 ENCOUNTER — Encounter: Payer: Self-pay | Admitting: Cardiovascular Disease

## 2012-06-26 VITALS — BP 141/81 | HR 94 | Ht 69.0 in | Wt 200.5 lb

## 2012-06-26 DIAGNOSIS — R609 Edema, unspecified: Secondary | ICD-10-CM

## 2012-06-26 DIAGNOSIS — F17200 Nicotine dependence, unspecified, uncomplicated: Secondary | ICD-10-CM

## 2012-06-26 DIAGNOSIS — J449 Chronic obstructive pulmonary disease, unspecified: Secondary | ICD-10-CM

## 2012-06-26 DIAGNOSIS — M79609 Pain in unspecified limb: Secondary | ICD-10-CM

## 2012-06-26 DIAGNOSIS — M7989 Other specified soft tissue disorders: Secondary | ICD-10-CM

## 2012-06-26 DIAGNOSIS — I509 Heart failure, unspecified: Secondary | ICD-10-CM

## 2012-06-26 DIAGNOSIS — I2510 Atherosclerotic heart disease of native coronary artery without angina pectoris: Secondary | ICD-10-CM

## 2012-06-26 DIAGNOSIS — I5032 Chronic diastolic (congestive) heart failure: Secondary | ICD-10-CM

## 2012-06-26 DIAGNOSIS — R0602 Shortness of breath: Secondary | ICD-10-CM

## 2012-06-26 DIAGNOSIS — R000 Tachycardia, unspecified: Secondary | ICD-10-CM

## 2012-06-26 NOTE — Assessment & Plan Note (Addendum)
Today he has mild shortness of breath. Likely secondary to mild diastolic CHF. He does have eating edema worse on the left than the right. Encouraged him to take more torsemide until edema has improved. He has only been taking 20 mg twice a day. We will call if pharmacy to recommend they stop packaging Lasix. This is not needed in addition to torsemide.

## 2012-06-26 NOTE — Patient Instructions (Addendum)
Pull the furosemide /lasix out of the packets- we called Glenn at Pharmacy with these instructions Please take torsemide 40 mg at least twice a day Take extra torsemide for worsening leg swelling  Left lower extremity duplex today at 4pm to r/o DVT  Please call us if you have new issues that need to be addressed before your next appt.  Your physician wants you to follow-up in: 6 months.  You will receive a reminder letter in the mail two months in advance. If you don't receive a letter, please call our office to schedule the follow-up appointment.

## 2012-06-26 NOTE — Assessment & Plan Note (Addendum)
Trace to 1+ pitting edema on the right, 1+ pitting edema on the left. Very tender on the left. Ultrasound today of the left lower extremity did not show DVT. He does have engorged venous vessels consistent with high right-sided pressures and need for higher torsemide dosing. Recommended torsemide 40 mg twice a day.

## 2012-06-26 NOTE — Progress Notes (Signed)
Patient ID: [name], male    DOB: [birth year], 72 y.o.   MRN: [id number]  HPI Comments: [name] is a pleasant 72 year old gentleman with end-stage COPD, on chronic oxygen, long history of smoking who continues to smoke,  history of obesity, obstructive sleep apnea by his report, chronic muscle spasms in his chest, coronary artery disease,  renal dysfunction and diastolic CHF  presenting  for followup.  He reports having recent worsening of his leg edema. Uncertain if he was noncompliant with his diuretics. Left leg was more swollen than the right and very sore. Edema has improved. He states he has been taking torsemide 20 mg twice a day. His medication packages that he brings with him today have torsemide 40 mg twice a day packaged. They also have Lasix 40 mg twice a day packaged. His breathing continues to be poor, very short with any exertion. He uses a motorized scooter most of the time.   hospitalization at [facility] last year.  Cardiac catheterization was performed October 11 2010 showed mild proximal left circumflex disease, moderate mid left circumflex disease, mild proximal LAD disease, ejection fraction 45-50%, severe right internal iliac arterial disease ( 30% diffuse proximal LAD disease, 40% proximal left circumflex, 50% diffuse mid left circumflex after the OM 2)  Medical management was recommended   Admission to the hospital August 2013 for worsening shortness of breath, wheezing and cough. Blood pressure was low. He was started on high-dose prednisone, antibiotics with improvement. He did have diastolic CHF and was treated with Lasix.  EKG shows sinus tachycardia with rate 94 beats per minute. APCs noted.     Outpatient Encounter Prescriptions as of 06/26/2012  Medication Sig Dispense Refill  . albuterol (PROVENTIL HFA;VENTOLIN HFA) 108 (90 BASE) MCG/ACT inhaler Inhale 2 puffs into the lungs every 6 (six) hours as needed.      Marland Kitchen albuterol (PROVENTIL) (2.5 MG/3ML) 0.083%  nebulizer solution Take 2.5 mg by nebulization as needed.        . ALPRAZolam (XANAX) 0.25 MG tablet Take 0.25 mg by mouth daily.        Marland Kitchen aspirin (ASPIR-81) 81 MG EC tablet Take 81 mg by mouth daily.        Marland Kitchen atorvastatin (LIPITOR) 20 MG tablet Take 1 tablet (20 mg total) by mouth daily.  30 tablet  6  . chlorzoxazone (PARAFON) 500 MG tablet Take 500 mg by mouth 2 (two) times daily. As needed for muscle spasm       . clonazePAM (KLONOPIN) 0.5 MG tablet Take 0.5 mg by mouth 2 (two) times daily.      . cyclobenzaprine (FLEXERIL) 10 MG tablet Take 10 mg by mouth 3 (three) times daily as needed.      . famotidine (PEPCID) 20 MG tablet Take 20 mg by mouth daily.      . ferrous sulfate 325 (65 FE) MG tablet Take 325 mg by mouth daily with breakfast.      . furosemide (LASIX) 40 MG tablet Take 1 tablet (40 mg total) by mouth 2 (two) times daily.  60 tablet  6  . gabapentin (NEURONTIN) 300 MG capsule Take 600 mg by mouth 3 (three) times daily.      Marland Kitchen guaiFENesin (MUCINEX) 600 MG 12 hr tablet Take 1,200 mg by mouth 2 (two) times daily.      Marland Kitchen HYDROcodone-acetaminophen (VICODIN) 5-500 MG per tablet Take 1 tablet by mouth 2 (two) times daily.       Marland Kitchen ipratropium-albuterol (  DUONEB) 0.5-2.5 (3) MG/3ML SOLN Take 3 mLs by nebulization as needed.        Marland Kitchen lisinopril (PRINIVIL,ZESTRIL) 10 MG tablet Take 10 mg by mouth 2 (two) times daily.      . nebivolol (BYSTOLIC) 5 MG tablet Take 1 tablet (5 mg total) by mouth daily.  30 tablet  6  . NON FORMULARY Oxygen 3 liters at rest. 4 liters with walking.      . polyethylene glycol powder (MIRALAX) powder Take 17 g by mouth daily.      . potassium chloride SA (K-DUR,KLOR-CON) 20 MEQ tablet Take 20 mEq by mouth daily.        . roflumilast (DALIRESP) 500 MCG TABS tablet Take 500 mcg by mouth daily.        Marland Kitchen spironolactone (ALDACTONE) 25 MG tablet Take 25 mg by mouth daily.        . theophylline (THEO-24) 300 MG 24 hr capsule Take 300 mg by mouth daily.        Marland Kitchen  tiotropium (SPIRIVA HANDIHALER) 18 MCG inhalation capsule Place 18 mcg into inhaler and inhale daily.         Review of Systems  HENT: Negative.   Eyes: Negative.   Respiratory: Positive for cough, shortness of breath and wheezing.   Cardiovascular: Positive for leg swelling.  Gastrointestinal: Negative.   Musculoskeletal: Positive for gait problem.  Skin: Negative.   Neurological: Negative.   Psychiatric/Behavioral: Negative.   All other systems reviewed and are negative.   BP 141/81  Pulse 94  Ht 5\' 9"  (1.753 m)  Wt 200 lb 8 oz (90.946 kg)  BMI 29.6 kg/m2  Physical Exam  Nursing note and vitals reviewed. Constitutional: He is oriented to person, place, and time. He appears well-developed and well-nourished.  HENT:  Head: Normocephalic.  Nose: Nose normal.  Mouth/Throat: Oropharynx is clear and moist.  Eyes: Conjunctivae are normal. Pupils are equal, round, and reactive to light.  Neck: Normal range of motion. Neck supple. No JVD present.  Cardiovascular: Normal rate, regular rhythm, S1 normal, S2 normal, normal heart sounds and intact distal pulses.  Exam reveals no gallop and no friction rub.   No murmur heard. Pulmonary/Chest: Effort normal. No respiratory distress. He has decreased breath sounds. He has wheezes. He has rales. He exhibits no tenderness.  Abdominal: Soft. Bowel sounds are normal. He exhibits no distension. There is no tenderness.  Musculoskeletal: Normal range of motion. He exhibits no edema and no tenderness.  Lymphadenopathy:    He has no cervical adenopathy.  Neurological: He is alert and oriented to person, place, and time. Coordination normal.  Skin: Skin is warm and dry. No rash noted. No erythema.  Psychiatric: He has a normal mood and affect. His behavior is normal. Judgment and thought content normal.      Assessment and Plan

## 2012-06-26 NOTE — Assessment & Plan Note (Signed)
Currently with no symptoms of angina. No further workup at this time. Continue current medication regimen. 

## 2012-06-26 NOTE — Assessment & Plan Note (Signed)
We have encouraged him to continue to work on weaning his cigarettes and smoking cessation. He will continue to work on this and does not want any assistance with chantix.  

## 2012-06-26 NOTE — Assessment & Plan Note (Signed)
Severe COPD. Followed by Dr. Welton Flakes.

## 2012-07-22 ENCOUNTER — Telehealth: Payer: Self-pay

## 2012-07-22 NOTE — Telephone Encounter (Signed)
I rec'd t/c from [facility]/Dr. Khan's office They saw pt today in their office and pt was very confused about which meds he should be taking They have d/c'd all meds at pharmacy and gave him new prescriptions based on our last note and their last note They are faxing me the last 2 office notes and asks that I review with Dr. Mariah Milling to make sure pt is taking correct meds We will call office back at [phone] Will await fax

## 2012-07-23 ENCOUNTER — Other Ambulatory Visit: Payer: Self-pay

## 2012-07-23 NOTE — Telephone Encounter (Signed)
I rec'd note from Dr. Welton Flakes from yesterday 7/7 Med list at Dr. Milta Deiters office was updated but does not match what we have pt taking I will update our list and have Dr. Mariah Milling compare new list to what we had documented him taking at last OV

## 2012-07-23 NOTE — Telephone Encounter (Signed)
Our med list has now been updated after comparing to PCP med list from visit yesterday Please review

## 2012-10-18 ENCOUNTER — Ambulatory Visit: Payer: Self-pay

## 2012-12-26 ENCOUNTER — Ambulatory Visit (INDEPENDENT_AMBULATORY_CARE_PROVIDER_SITE_OTHER): Payer: Medicare Other | Admitting: Cardiovascular Disease

## 2012-12-26 ENCOUNTER — Encounter: Payer: Self-pay | Admitting: Cardiovascular Disease

## 2012-12-26 VITALS — BP 120/68 | HR 90 | Ht 69.0 in | Wt 187.0 lb

## 2012-12-26 DIAGNOSIS — I509 Heart failure, unspecified: Secondary | ICD-10-CM

## 2012-12-26 DIAGNOSIS — R079 Chest pain, unspecified: Secondary | ICD-10-CM

## 2012-12-26 DIAGNOSIS — J449 Chronic obstructive pulmonary disease, unspecified: Secondary | ICD-10-CM | POA: Insufficient documentation

## 2012-12-26 DIAGNOSIS — F17200 Nicotine dependence, unspecified, uncomplicated: Secondary | ICD-10-CM

## 2012-12-26 DIAGNOSIS — I2510 Atherosclerotic heart disease of native coronary artery without angina pectoris: Secondary | ICD-10-CM

## 2012-12-26 DIAGNOSIS — I5032 Chronic diastolic (congestive) heart failure: Secondary | ICD-10-CM

## 2012-12-26 NOTE — Progress Notes (Signed)
Patient ID: [name], male    DOB: Jun 06, 1940, 72 y.o.   MRN: [id number]  HPI Comments: [name] is a pleasant 72 year old gentleman with end-stage COPD, on chronic oxygen, long history of smoking who continues to smoke,  history of obesity, obstructive sleep apnea by his report, chronic muscle spasms in his chest, coronary artery disease,  renal dysfunction and diastolic CHF  presenting  for followup.  He reports having good days and bad days. Today he reports is a bad day. He has SOB, wheezing, breathing is tight.  In general, his breathing continues to be poor, very short with any exertion. He uses a motorized scooter most of the time.  He is smoking more than 1/2 pd. He is unab le to walk far secondary to neuropathy of the lower extremities. No significant recent leg edema.   hospitalization at [facility] last year.  Cardiac catheterization was performed October 11 2010 showed mild proximal left circumflex disease, moderate mid left circumflex disease, mild proximal LAD disease, ejection fraction 45-50%, severe right internal iliac arterial disease ( 30% diffuse proximal LAD disease, 40% proximal left circumflex, 50% diffuse mid left circumflex after the OM 2)  Medical management was recommended   Admission to the hospital August 2013 for worsening shortness of breath, wheezing and cough. Blood pressure was low. He was started on high-dose prednisone, antibiotics with improvement. He did have diastolic CHF and was treated with Lasix.  EKG shows sinus tachycardia with rate 90 beats per minute. RBBB, rare PVC     Outpatient Encounter Prescriptions as of 12/26/2012  Medication Sig  . albuterol (PROVENTIL HFA;VENTOLIN HFA) 108 (90 BASE) MCG/ACT inhaler Inhale 2 puffs into the lungs every 6 (six) hours as needed for wheezing or shortness of breath.  Marland Kitchen albuterol (PROVENTIL) (2.5 MG/3ML) 0.083% nebulizer solution Take 2.5 mg by nebulization every 6 (six) hours as needed for wheezing or  shortness of breath.  Marland Kitchen aspirin (ASPIR-81) 81 MG EC tablet Take 81 mg by mouth daily.    Marland Kitchen atorvastatin (LIPITOR) 10 MG tablet Take 10 mg by mouth daily.  . cholecalciferol (VITAMIN D) 1000 UNITS tablet Take 1,000 Units by mouth daily.  . clonazePAM (KLONOPIN) 0.5 MG tablet Take 0.5 mg by mouth 2 (two) times daily.  Marland Kitchen diltiazem (CARDIZEM) 120 MG tablet Take 120 mg by mouth daily.  Marland Kitchen docusate sodium (COLACE) 100 MG capsule Take 100 mg by mouth 2 (two) times daily.  . fluticasone (FLONASE) 50 MCG/ACT nasal spray Place 1 spray into both nostrils as needed for allergies or rhinitis.  . Fluticasone-Salmeterol (ADVAIR) 250-50 MCG/DOSE AEPB Inhale 1 puff into the lungs 2 (two) times daily.  Marland Kitchen gabapentin (NEURONTIN) 300 MG capsule Take 600 mg by mouth 3 (three) times daily.  Marland Kitchen ipratropium-albuterol (DUONEB) 0.5-2.5 (3) MG/3ML SOLN Take 3 mLs by nebulization as needed.    Marland Kitchen lisinopril (PRINIVIL,ZESTRIL) 30 MG tablet Take 30 mg by mouth daily.  . NON FORMULARY Oxygen 3 liters at rest. 4 liters with walking.  Marland Kitchen omeprazole (PRILOSEC) 40 MG capsule Take 40 mg by mouth daily.  . potassium chloride (K-DUR,KLOR-CON) 10 MEQ tablet Take 10 mEq by mouth daily.  . roflumilast (DALIRESP) 500 MCG TABS tablet Take 500 mcg by mouth daily.    Marland Kitchen spironolactone (ALDACTONE) 25 MG tablet Take 25 mg by mouth daily.    . theophylline (THEODUR) 100 MG 12 hr tablet Take 100 mg by mouth daily.  Marland Kitchen tiotropium (SPIRIVA HANDIHALER) 18 MCG inhalation capsule Place 18 mcg  into inhaler and inhale daily.   Marland Kitchen torsemide (DEMADEX) 20 MG tablet Take 20 mg by mouth daily. Take 2 tablets in am, may take an extra tablet in PM as needed for swelling  . [DISCONTINUED] atorvastatin (LIPITOR) 20 MG tablet Take 10 mg by mouth daily.  . [DISCONTINUED] theophylline (THEO-24) 300 MG 24 hr capsule Take 300 mg by mouth daily.       Review of Systems  HENT: Negative.   Eyes: Negative.   Respiratory: Positive for shortness of breath and wheezing.    Gastrointestinal: Negative.   Musculoskeletal: Positive for gait problem.  Skin: Negative.   Neurological: Negative.   Psychiatric/Behavioral: Negative.   All other systems reviewed and are negative.   BP 120/68  Pulse 90  Ht 5\' 9"  (1.753 m)  Wt 187 lb (84.823 kg)  BMI 27.60 kg/m2  Physical Exam  Nursing note and vitals reviewed. Constitutional: He is oriented to person, place, and time. He appears well-developed and well-nourished.  HENT:  Head: Normocephalic.  Nose: Nose normal.  Mouth/Throat: Oropharynx is clear and moist.  Eyes: Conjunctivae are normal. Pupils are equal, round, and reactive to light.  Neck: Normal range of motion. Neck supple. No JVD present.  Cardiovascular: Normal rate, regular rhythm, S1 normal, S2 normal, normal heart sounds and intact distal pulses.  Exam reveals no gallop and no friction rub.   No murmur heard. Pulmonary/Chest: Effort normal. No respiratory distress. He has decreased breath sounds. He has wheezes. He has rales. He exhibits no tenderness.  Abdominal: Soft. Bowel sounds are normal. He exhibits no distension. There is no tenderness.  Musculoskeletal: Normal range of motion. He exhibits no edema and no tenderness.  Lymphadenopathy:    He has no cervical adenopathy.  Neurological: He is alert and oriented to person, place, and time. Coordination normal.  Skin: Skin is warm and dry. No rash noted. No erythema.  Psychiatric: He has a normal mood and affect. His behavior is normal. Judgment and thought content normal.      Assessment and Plan

## 2012-12-26 NOTE — Assessment & Plan Note (Signed)
Encouraged him to stay on his diuretics. Appears euvolemic

## 2012-12-26 NOTE — Assessment & Plan Note (Signed)
We have encouraged him to continue to work on weaning his cigarettes and smoking cessation. He will continue to work on this and does not want any assistance with chantix.  

## 2012-12-26 NOTE — Assessment & Plan Note (Signed)
Currently with no symptoms of angina. No further workup at this time. Continue current medication regimen. 

## 2012-12-26 NOTE — Patient Instructions (Signed)
You are doing well. No medication changes were made.  Please call us if you have new issues that need to be addressed before your next appt.  Your physician wants you to follow-up in: 6 months.  You will receive a reminder letter in the mail two months in advance. If you don't receive a letter, please call our office to schedule the follow-up appointment.   

## 2012-12-26 NOTE — Assessment & Plan Note (Signed)
Severe lung disease, on oxygen. Chronic cough, uses nebs at home Q4 to Q6

## 2013-02-03 ENCOUNTER — Inpatient Hospital Stay: Payer: Self-pay | Admitting: Student

## 2013-02-03 LAB — CBC
HCT: 43.3 % (ref 40.0–52.0)
HGB: 14 g/dL (ref 13.0–18.0)
MCH: 29.9 pg (ref 26.0–34.0)
MCHC: 32.3 g/dL (ref 32.0–36.0)
MCV: 93 fL (ref 80–100)
Platelet: 167 10*3/uL (ref 150–440)
RBC: 4.67 10*6/uL (ref 4.40–5.90)
RDW: 14.7 % — ABNORMAL HIGH (ref 11.5–14.5)
WBC: 9.1 10*3/uL (ref 3.8–10.6)

## 2013-02-03 LAB — COMPREHENSIVE METABOLIC PANEL
ALT: 12 U/L (ref 12–78)
Albumin: 3.3 g/dL — ABNORMAL LOW (ref 3.4–5.0)
Alkaline Phosphatase: 84 U/L
Anion Gap: 1 — ABNORMAL LOW (ref 7–16)
BUN: 20 mg/dL — ABNORMAL HIGH (ref 7–18)
Bilirubin,Total: 0.8 mg/dL (ref 0.2–1.0)
Calcium, Total: 9.1 mg/dL (ref 8.5–10.1)
Chloride: 98 mmol/L (ref 98–107)
Co2: 38 mmol/L — ABNORMAL HIGH (ref 21–32)
Creatinine: 0.99 mg/dL (ref 0.60–1.30)
EGFR (African American): >60
EGFR (Non-African Amer.): >60
Glucose: 103 mg/dL — ABNORMAL HIGH (ref 65–99)
Osmolality: 277 (ref 275–301)
POTASSIUM: 4.1 mmol/L (ref 3.5–5.1)
SGOT(AST): 7 U/L — ABNORMAL LOW (ref 15–37)
Sodium: 137 mmol/L (ref 136–145)
Total Protein: 7.7 g/dL (ref 6.4–8.2)

## 2013-02-03 LAB — TROPONIN I: Troponin-I: 0.03 ng/mL

## 2013-02-03 LAB — PRO B NATRIURETIC PEPTIDE: B-Type Natriuretic Peptide: 2107 pg/mL — ABNORMAL HIGH (ref 0–125)

## 2013-02-03 LAB — THEOPHYLLINE LEVEL: Theophylline: <2 ug/mL — ABNORMAL LOW (ref 10.0–20.0)

## 2013-02-04 LAB — CBC WITH DIFFERENTIAL/PLATELET
BASOS PCT: 0.1 %
Basophil #: 0 10*3/uL (ref 0.0–0.1)
Eosinophil #: 0 10*3/uL (ref 0.0–0.7)
Eosinophil %: 0 %
HCT: 41.4 % (ref 40.0–52.0)
HGB: 13.4 g/dL (ref 13.0–18.0)
LYMPHS ABS: 0.3 10*3/uL — AB (ref 1.0–3.6)
LYMPHS PCT: 5.3 %
MCH: 30.1 pg (ref 26.0–34.0)
MCHC: 32.3 g/dL (ref 32.0–36.0)
MCV: 93 fL (ref 80–100)
MONO ABS: 0.3 x10 3/mm (ref 0.2–1.0)
Monocyte %: 4 %
NEUTROS ABS: 5.9 10*3/uL (ref 1.4–6.5)
NEUTROS PCT: 90.6 %
Platelet: 173 10*3/uL (ref 150–440)
RBC: 4.45 10*6/uL (ref 4.40–5.90)
RDW: 14.4 % (ref 11.5–14.5)
WBC: 6.5 10*3/uL (ref 3.8–10.6)

## 2013-02-04 LAB — BASIC METABOLIC PANEL
ANION GAP: 3 — AB (ref 7–16)
BUN: 25 mg/dL — ABNORMAL HIGH (ref 7–18)
CHLORIDE: 93 mmol/L — AB (ref 98–107)
Calcium, Total: 9.2 mg/dL (ref 8.5–10.1)
Co2: 37 mmol/L — ABNORMAL HIGH (ref 21–32)
Creatinine: 0.92 mg/dL (ref 0.60–1.30)
EGFR (African American): >60
Glucose: 137 mg/dL — ABNORMAL HIGH (ref 65–99)
Osmolality: 273 (ref 275–301)
Potassium: 4.3 mmol/L (ref 3.5–5.1)
Sodium: 133 mmol/L — ABNORMAL LOW (ref 136–145)

## 2013-02-04 LAB — MAGNESIUM: Magnesium: 2.4 mg/dL

## 2013-02-07 LAB — OCCULT BLOOD X 1 CARD TO LAB, STOOL: Occult Blood, Feces: NEGATIVE

## 2013-02-07 LAB — EXPECTORATED SPUTUM ASSESSMENT W GRAM STAIN, RFLX TO RESP C

## 2013-02-08 LAB — CULTURE, BLOOD (SINGLE)

## 2013-02-09 LAB — BASIC METABOLIC PANEL
Anion Gap: 4 — ABNORMAL LOW (ref 7–16)
BUN: 35 mg/dL — AB (ref 7–18)
CALCIUM: 8.8 mg/dL (ref 8.5–10.1)
CHLORIDE: 93 mmol/L — AB (ref 98–107)
CO2: 37 mmol/L — AB (ref 21–32)
CREATININE: 1.08 mg/dL (ref 0.60–1.30)
EGFR (African American): >60
Glucose: 220 mg/dL — ABNORMAL HIGH (ref 65–99)
Osmolality: 283 (ref 275–301)
Potassium: 4.3 mmol/L (ref 3.5–5.1)
Sodium: 134 mmol/L — ABNORMAL LOW (ref 136–145)

## 2013-02-09 LAB — CULTURE, BLOOD (SINGLE)

## 2013-02-25 ENCOUNTER — Inpatient Hospital Stay: Payer: Self-pay | Admitting: Student

## 2013-02-25 LAB — COMPREHENSIVE METABOLIC PANEL
Albumin: 3.6 g/dL (ref 3.4–5.0)
Alkaline Phosphatase: 82 U/L
Anion Gap: 4 — ABNORMAL LOW (ref 7–16)
BUN: 24 mg/dL — ABNORMAL HIGH (ref 7–18)
Bilirubin,Total: 0.7 mg/dL (ref 0.2–1.0)
CHLORIDE: 97 mmol/L — AB (ref 98–107)
CREATININE: 0.97 mg/dL (ref 0.60–1.30)
Calcium, Total: 9.2 mg/dL (ref 8.5–10.1)
Co2: 34 mmol/L — ABNORMAL HIGH (ref 21–32)
EGFR (African American): >60
EGFR (Non-African Amer.): >60
GLUCOSE: 95 mg/dL (ref 65–99)
Osmolality: 274 (ref 275–301)
Potassium: 4 mmol/L (ref 3.5–5.1)
SGOT(AST): 9 U/L — ABNORMAL LOW (ref 15–37)
SGPT (ALT): 21 U/L (ref 12–78)
Sodium: 135 mmol/L — ABNORMAL LOW (ref 136–145)
TOTAL PROTEIN: 7 g/dL (ref 6.4–8.2)

## 2013-02-25 LAB — CBC
HCT: 44.2 % (ref 40.0–52.0)
HGB: 14.2 g/dL (ref 13.0–18.0)
MCH: 30.7 pg (ref 26.0–34.0)
MCHC: 32.2 g/dL (ref 32.0–36.0)
MCV: 95 fL (ref 80–100)
PLATELETS: 81 10*3/uL — AB (ref 150–440)
RBC: 4.64 10*6/uL (ref 4.40–5.90)
RDW: 16 % — AB (ref 11.5–14.5)
WBC: 8.4 10*3/uL (ref 3.8–10.6)

## 2013-02-25 LAB — TROPONIN I: TROPONIN-I: 0.03 ng/mL

## 2013-02-25 LAB — CK TOTAL AND CKMB (NOT AT ARMC)
CK, Total: 34 U/L — ABNORMAL LOW
CK-MB: 3 ng/mL (ref 0.5–3.6)

## 2013-02-25 LAB — PRO B NATRIURETIC PEPTIDE: B-TYPE NATIURETIC PEPTID: 908 pg/mL — AB (ref 0–125)

## 2013-02-26 DIAGNOSIS — I5043 Acute on chronic combined systolic (congestive) and diastolic (congestive) heart failure: Secondary | ICD-10-CM

## 2013-02-26 DIAGNOSIS — I739 Peripheral vascular disease, unspecified: Secondary | ICD-10-CM

## 2013-02-26 DIAGNOSIS — I498 Other specified cardiac arrhythmias: Secondary | ICD-10-CM

## 2013-02-26 DIAGNOSIS — I2510 Atherosclerotic heart disease of native coronary artery without angina pectoris: Secondary | ICD-10-CM

## 2013-02-26 DIAGNOSIS — I359 Nonrheumatic aortic valve disorder, unspecified: Secondary | ICD-10-CM

## 2013-02-26 LAB — BASIC METABOLIC PANEL
Anion Gap: 6 — ABNORMAL LOW (ref 7–16)
BUN: 22 mg/dL — ABNORMAL HIGH (ref 7–18)
CALCIUM: 9.2 mg/dL (ref 8.5–10.1)
Chloride: 96 mmol/L — ABNORMAL LOW (ref 98–107)
Co2: 33 mmol/L — ABNORMAL HIGH (ref 21–32)
Creatinine: 0.94 mg/dL (ref 0.60–1.30)
Glucose: 110 mg/dL — ABNORMAL HIGH (ref 65–99)
Osmolality: 274 (ref 275–301)
POTASSIUM: 4.7 mmol/L (ref 3.5–5.1)
SODIUM: 135 mmol/L — AB (ref 136–145)

## 2013-02-27 LAB — BASIC METABOLIC PANEL
Anion Gap: 4 — ABNORMAL LOW (ref 7–16)
BUN: 40 mg/dL — ABNORMAL HIGH (ref 7–18)
CALCIUM: 9.4 mg/dL (ref 8.5–10.1)
CO2: 37 mmol/L — AB (ref 21–32)
Chloride: 96 mmol/L — ABNORMAL LOW (ref 98–107)
Creatinine: 1.17 mg/dL (ref 0.60–1.30)
EGFR (Non-African Amer.): >60
GLUCOSE: 91 mg/dL (ref 65–99)
OSMOLALITY: 283 (ref 275–301)
POTASSIUM: 4.3 mmol/L (ref 3.5–5.1)
Sodium: 137 mmol/L (ref 136–145)

## 2013-03-02 LAB — CULTURE, BLOOD (SINGLE)

## 2013-03-03 DIAGNOSIS — I5043 Acute on chronic combined systolic (congestive) and diastolic (congestive) heart failure: Secondary | ICD-10-CM

## 2013-03-03 DIAGNOSIS — I2510 Atherosclerotic heart disease of native coronary artery without angina pectoris: Secondary | ICD-10-CM

## 2013-03-03 LAB — CREATININE, SERUM
CREATININE: 1.11 mg/dL (ref 0.60–1.30)
EGFR (Non-African Amer.): >60

## 2013-03-07 LAB — CREATININE, SERUM
Creatinine: 1.31 mg/dL — ABNORMAL HIGH (ref 0.60–1.30)
EGFR (African American): >60
EGFR (Non-African Amer.): 54 — ABNORMAL LOW

## 2013-04-16 ENCOUNTER — Ambulatory Visit: Payer: Self-pay | Admitting: Internal Medicine

## 2013-05-07 ENCOUNTER — Inpatient Hospital Stay: Payer: Self-pay | Admitting: Internal Medicine

## 2013-05-07 DIAGNOSIS — I498 Other specified cardiac arrhythmias: Secondary | ICD-10-CM

## 2013-05-07 DIAGNOSIS — J9620 Acute and chronic respiratory failure, unspecified whether with hypoxia or hypercapnia: Secondary | ICD-10-CM

## 2013-05-07 DIAGNOSIS — I5043 Acute on chronic combined systolic (congestive) and diastolic (congestive) heart failure: Secondary | ICD-10-CM

## 2013-05-07 LAB — CBC
HCT: 41.4 % (ref 40.0–52.0)
HGB: 12.9 g/dL — ABNORMAL LOW (ref 13.0–18.0)
MCH: 29.5 pg (ref 26.0–34.0)
MCHC: 31.2 g/dL — ABNORMAL LOW (ref 32.0–36.0)
MCV: 94 fL (ref 80–100)
Platelet: 155 10*3/uL (ref 150–440)
RBC: 4.38 10*6/uL — ABNORMAL LOW (ref 4.40–5.90)
RDW: 17.2 % — ABNORMAL HIGH (ref 11.5–14.5)
WBC: 10.6 10*3/uL (ref 3.8–10.6)

## 2013-05-07 LAB — COMPREHENSIVE METABOLIC PANEL
ALT: 14 U/L (ref 12–78)
Albumin: 3.3 g/dL — ABNORMAL LOW (ref 3.4–5.0)
Alkaline Phosphatase: 69 U/L
Anion Gap: 1 — ABNORMAL LOW (ref 7–16)
BILIRUBIN TOTAL: 0.5 mg/dL (ref 0.2–1.0)
BUN: 19 mg/dL — AB (ref 7–18)
CHLORIDE: 99 mmol/L (ref 98–107)
CREATININE: 0.84 mg/dL (ref 0.60–1.30)
Calcium, Total: 9.2 mg/dL (ref 8.5–10.1)
Co2: 41 mmol/L (ref 21–32)
EGFR (African American): >60
EGFR (Non-African Amer.): >60
Glucose: 103 mg/dL — ABNORMAL HIGH (ref 65–99)
OSMOLALITY: 284 (ref 275–301)
Potassium: 4.3 mmol/L (ref 3.5–5.1)
SGOT(AST): 14 U/L — ABNORMAL LOW (ref 15–37)
Sodium: 141 mmol/L (ref 136–145)
Total Protein: 6.3 g/dL — ABNORMAL LOW (ref 6.4–8.2)

## 2013-05-07 LAB — CK TOTAL AND CKMB (NOT AT ARMC)
CK, TOTAL: 33 U/L — AB
CK-MB: 2.6 ng/mL (ref 0.5–3.6)

## 2013-05-07 LAB — TROPONIN I: Troponin-I: 0.07 ng/mL — ABNORMAL HIGH

## 2013-05-08 LAB — BASIC METABOLIC PANEL
ANION GAP: 4 — AB (ref 7–16)
BUN: 24 mg/dL — ABNORMAL HIGH (ref 7–18)
CALCIUM: 8.8 mg/dL (ref 8.5–10.1)
CHLORIDE: 93 mmol/L — AB (ref 98–107)
CO2: 44 mmol/L — AB (ref 21–32)
CREATININE: 1.11 mg/dL (ref 0.60–1.30)
EGFR (African American): >60
EGFR (Non-African Amer.): >60
GLUCOSE: 208 mg/dL — AB (ref 65–99)
OSMOLALITY: 291 (ref 275–301)
POTASSIUM: 3.9 mmol/L (ref 3.5–5.1)
SODIUM: 141 mmol/L (ref 136–145)

## 2013-05-08 LAB — CBC WITH DIFFERENTIAL/PLATELET
BASOS PCT: 0.1 %
Basophil #: 0 10*3/uL (ref 0.0–0.1)
Eosinophil #: 0 10*3/uL (ref 0.0–0.7)
Eosinophil %: 0 %
HCT: 39.1 % — ABNORMAL LOW (ref 40.0–52.0)
HGB: 12.3 g/dL — ABNORMAL LOW (ref 13.0–18.0)
Lymphocyte #: 0.2 10*3/uL — ABNORMAL LOW (ref 1.0–3.6)
Lymphocyte %: 3.5 %
MCH: 29.4 pg (ref 26.0–34.0)
MCHC: 31.3 g/dL — ABNORMAL LOW (ref 32.0–36.0)
MCV: 94 fL (ref 80–100)
MONOS PCT: 4.2 %
Monocyte #: 0.3 x10 3/mm (ref 0.2–1.0)
Neutrophil #: 6.4 10*3/uL (ref 1.4–6.5)
Neutrophil %: 92.2 %
Platelet: 138 10*3/uL — ABNORMAL LOW (ref 150–440)
RBC: 4.17 10*6/uL — AB (ref 4.40–5.90)
RDW: 17.1 % — ABNORMAL HIGH (ref 11.5–14.5)
WBC: 7 10*3/uL (ref 3.8–10.6)

## 2013-05-15 LAB — EXPECTORATED SPUTUM ASSESSMENT W REFEX TO RESP CULTURE

## 2013-05-16 ENCOUNTER — Ambulatory Visit: Payer: Self-pay | Admitting: Internal Medicine

## 2013-06-16 DEATH — deceased

## 2013-06-17 ENCOUNTER — Ambulatory Visit: Payer: Medicare Other | Admitting: Cardiovascular Disease

## 2013-06-17 ENCOUNTER — Encounter: Payer: Self-pay | Admitting: *Deleted

## 2013-09-27 IMAGING — CR DG ABDOMEN 2V
1 series · 4 of 4 positions shown · non-contrast
Comparison: none

REASON FOR EXAM: abd distension and tenderness
COMMENTS:

PROCEDURE:     DXR - DXR ABDOMEN 2 V FLAT AND ERECT  - August 23, 2011  [DATE]
RESULT:

[Series 1: x abdomen erect · 0.14mm/px · 4 of 4 slices shown]
[im 1/4]
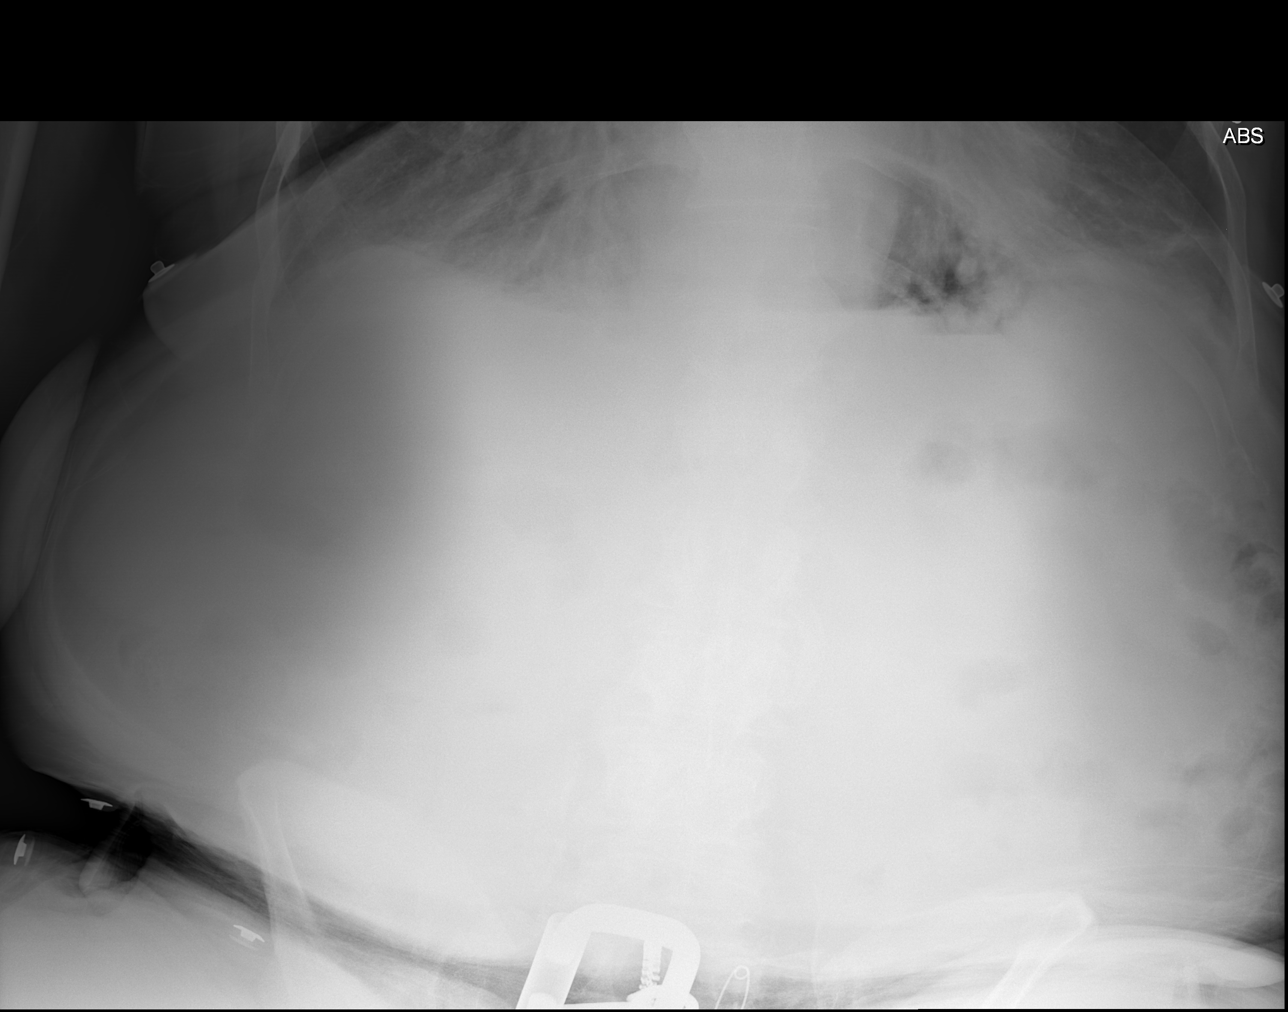
[im 2/4]
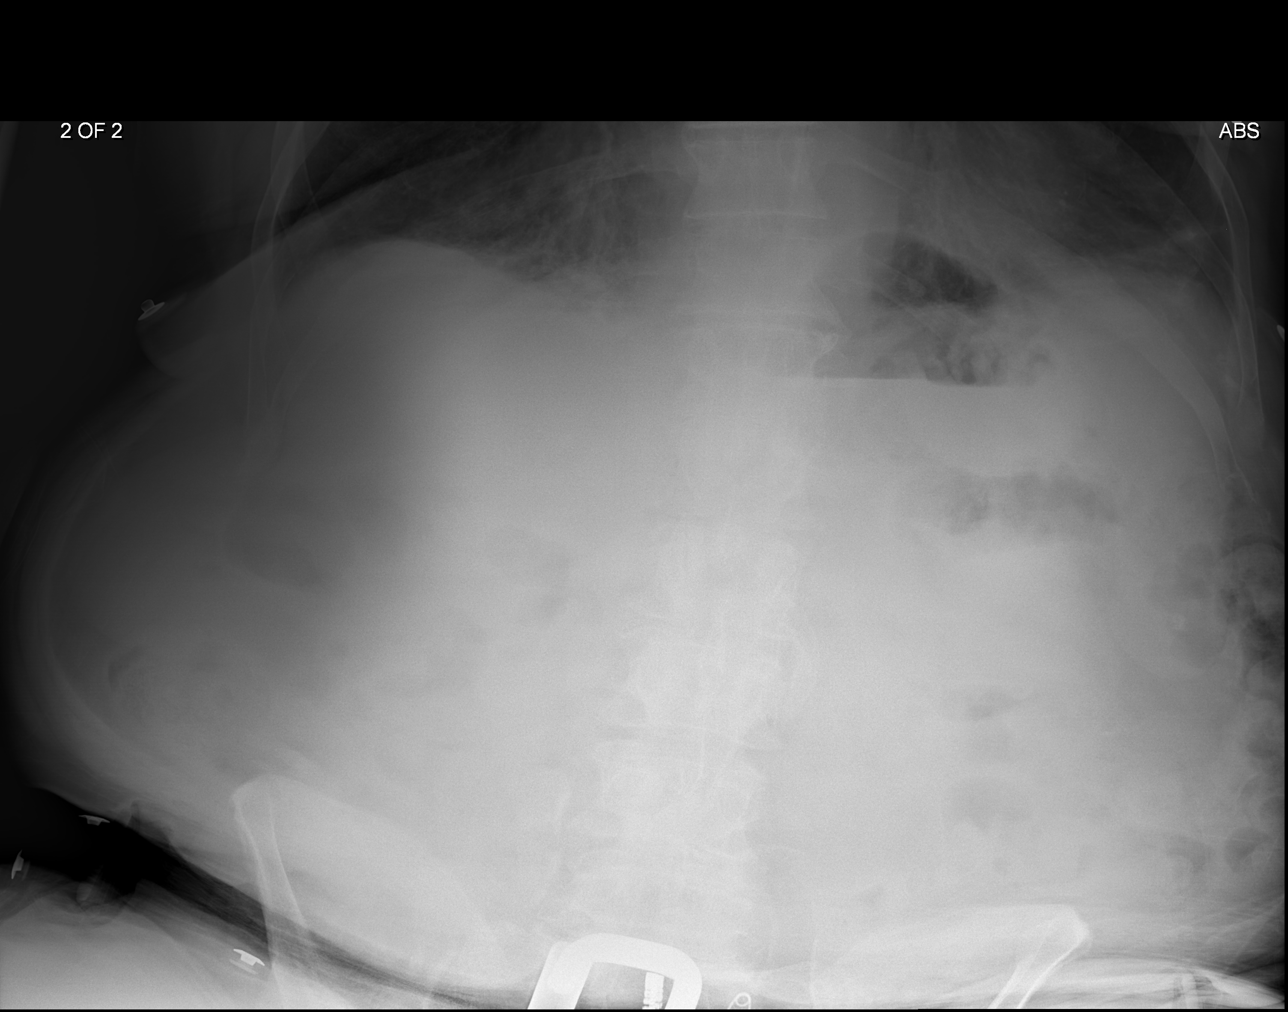
[im 3/4]
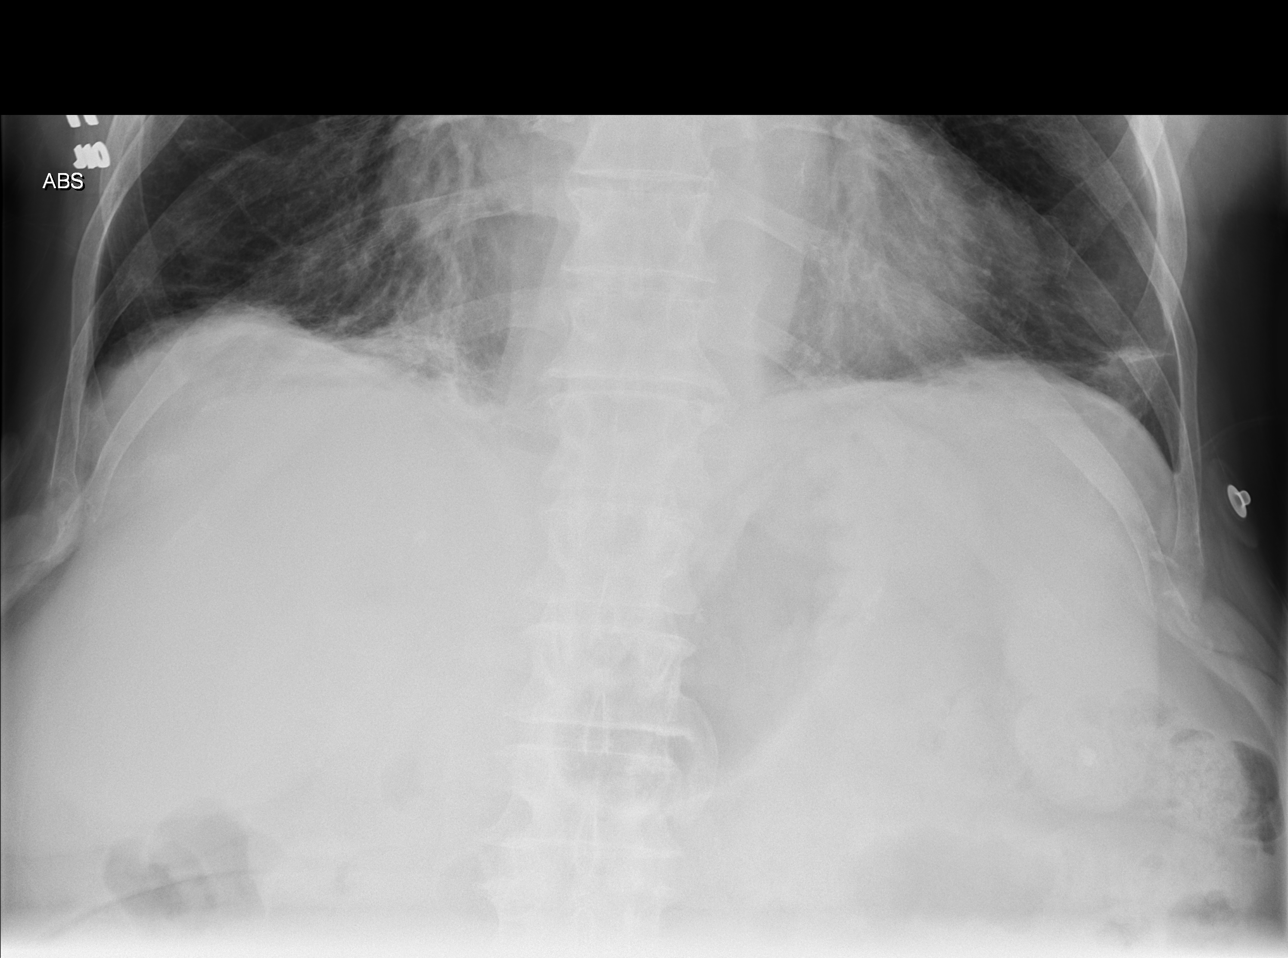
[im 4/4]
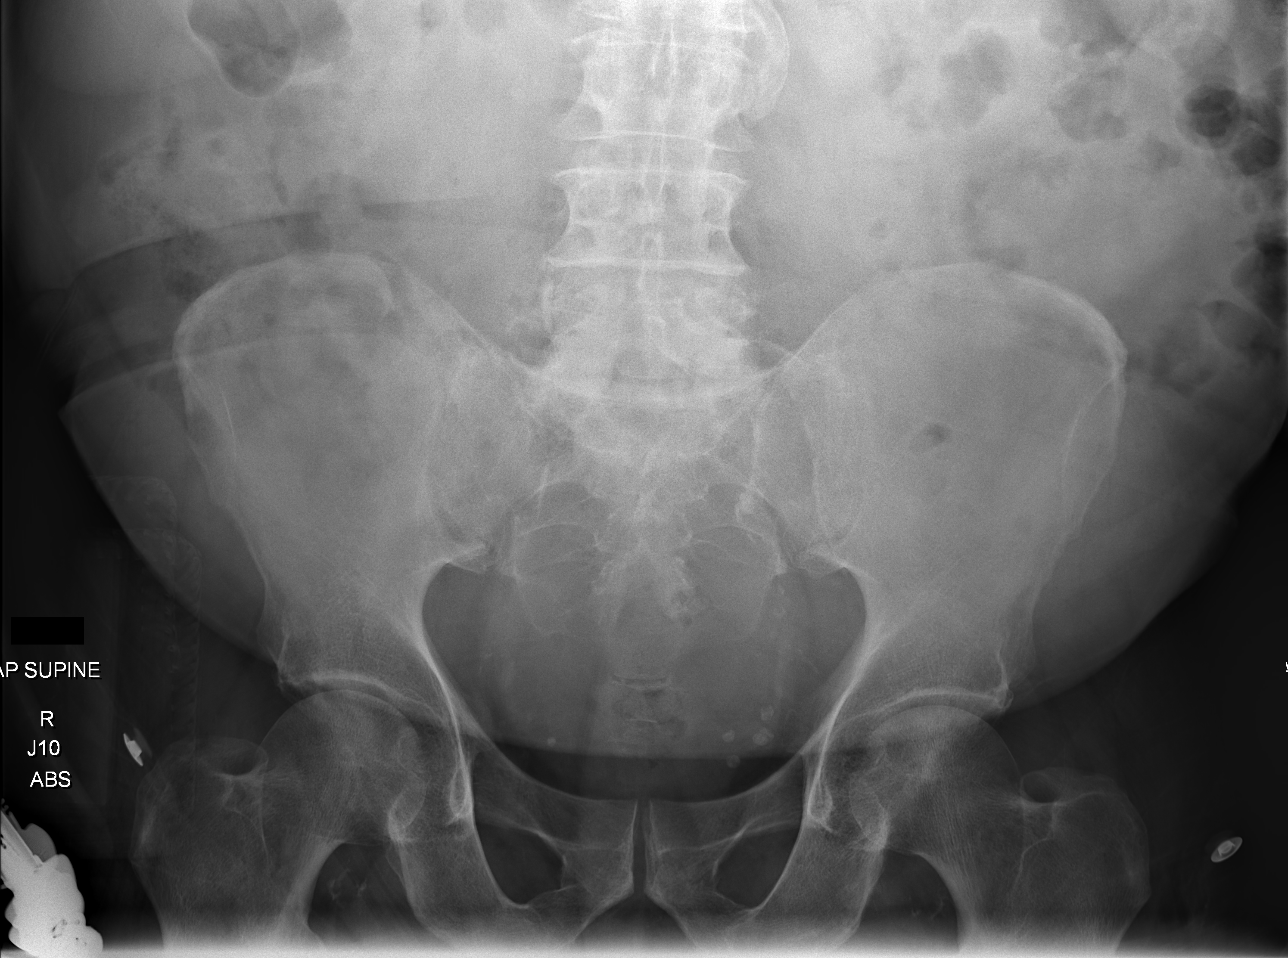

[4 of 4 positions shown; findings below may reference images not displayed]

FINDINGS: There is a paucity of bowel gas. Stool is appreciated within loops
of bowel. The visualized bony skeleton is unremarkable.
IMPRESSION: Nonspecific, nonobstructive bowel gas pattern with a
moderate amount of stool.

## 2013-09-27 IMAGING — CR DG CHEST 1V PORT
1 series · 1 of 1 positions shown · non-contrast
Comparison: none

REASON FOR EXAM: sob
COMMENTS:

[portable]
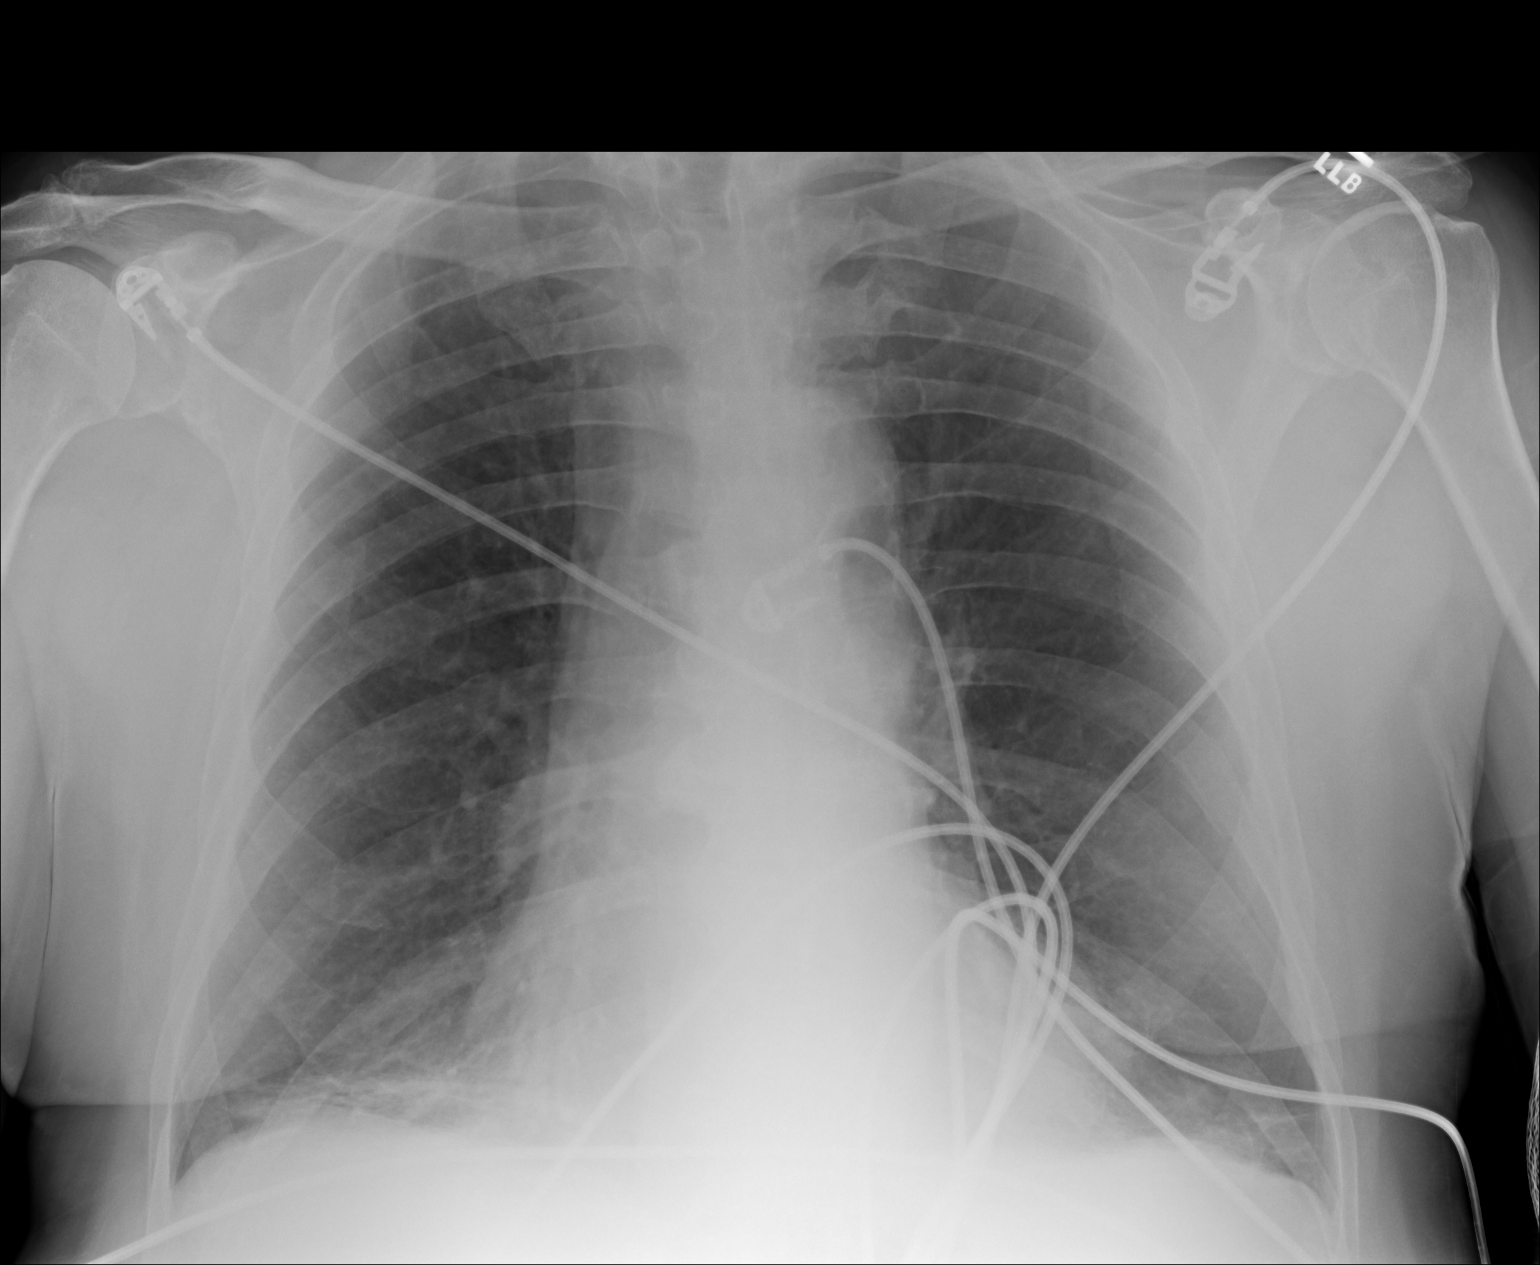

[1 of 1 positions shown; findings below may reference images not displayed]

PROCEDURE:     DXR - DXR PORTABLE CHEST SINGLE VIEW  - August 23, 2011 [DATE]

RESULT:     Frontal view of the chest is performed.

Comparison is made to a prior study dated 01/02/2011.

The lungs are hyperinflated. Linear areas of increased density project
within the lung bases. The cardiac silhouette is moderately enlarged and the
visualized bony skeleton is unremarkable.
IMPRESSION: 1.  COPD.
2.  Likely discoid atelectasis within the lung bases.
3.  No focal regions of consolidation are appreciated. Note, bibasilar
infiltrates cannot be completely excluded.

## 2014-04-15 IMAGING — CR DG CHEST 1V PORT
1 series · 2 of 2 positions shown · non-contrast
Comparison: none

REASON FOR EXAM: sob
COMMENTS:

[Series 1: ap · 0.17mm/px · 2 of 2 slices shown]
[im 1/2]
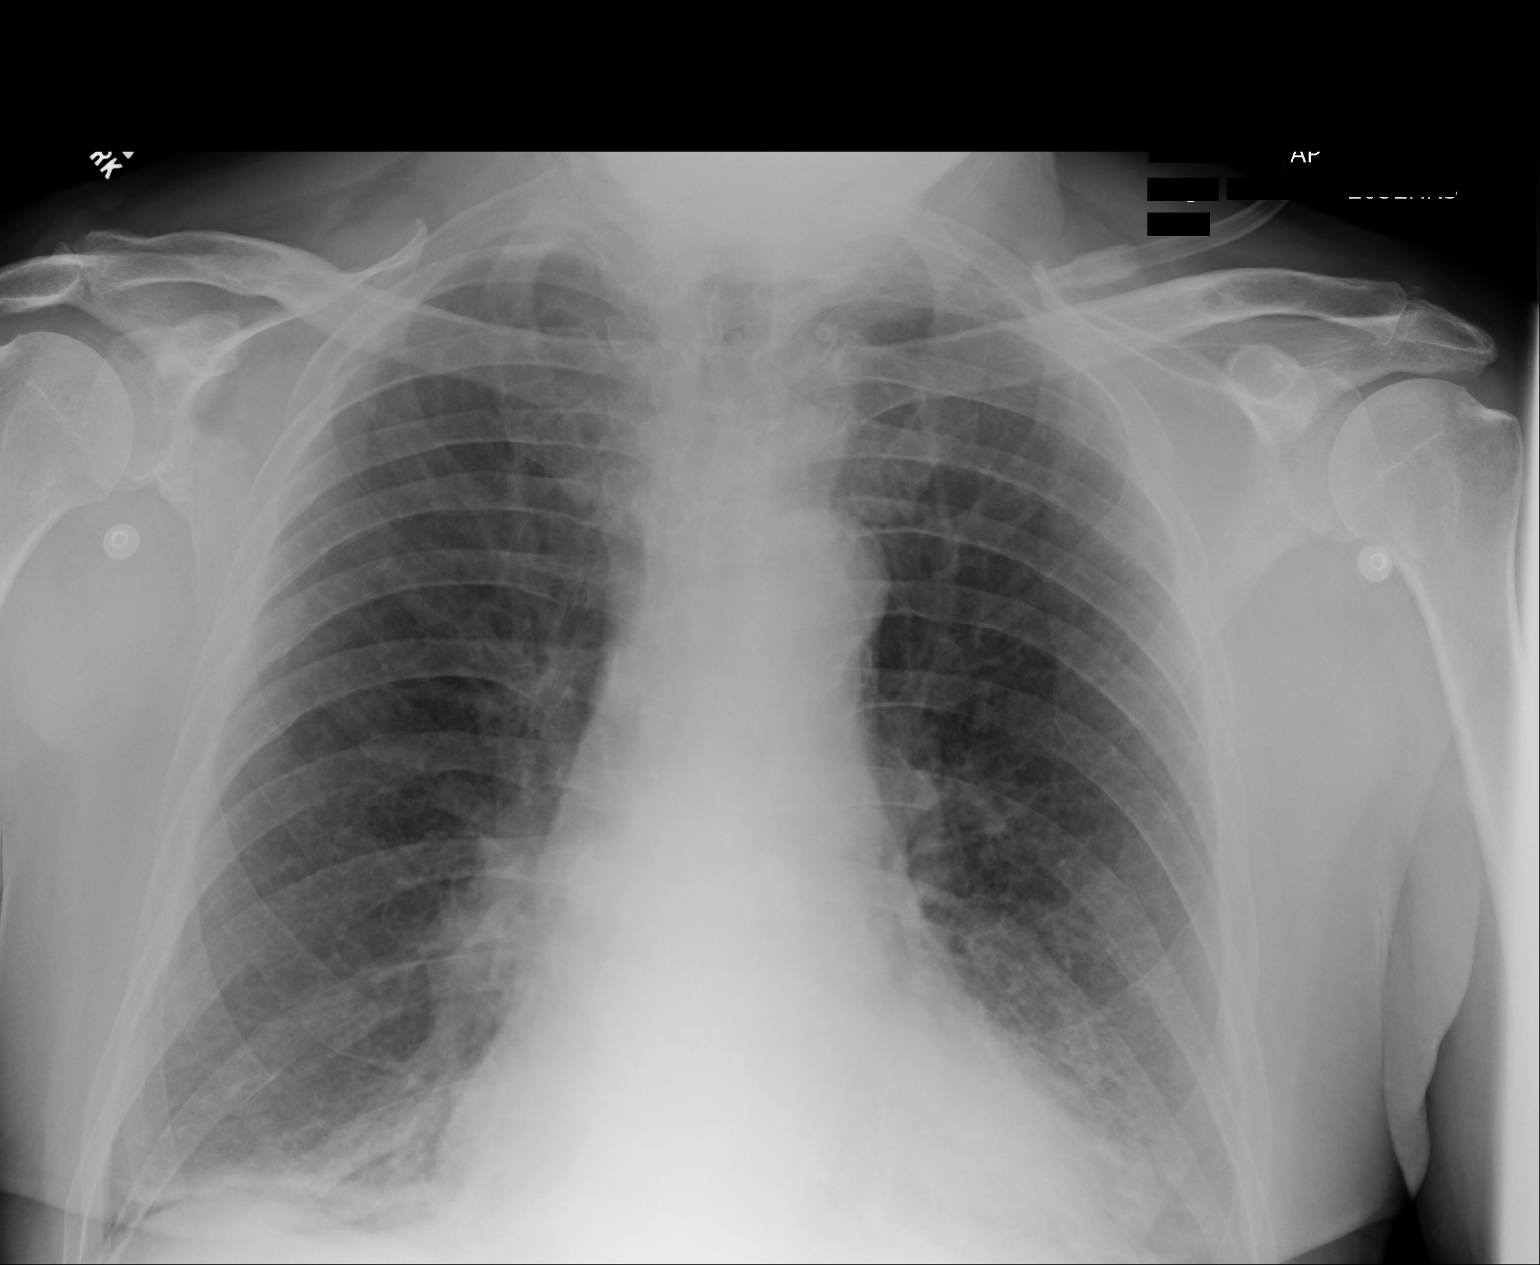
[im 2/2]
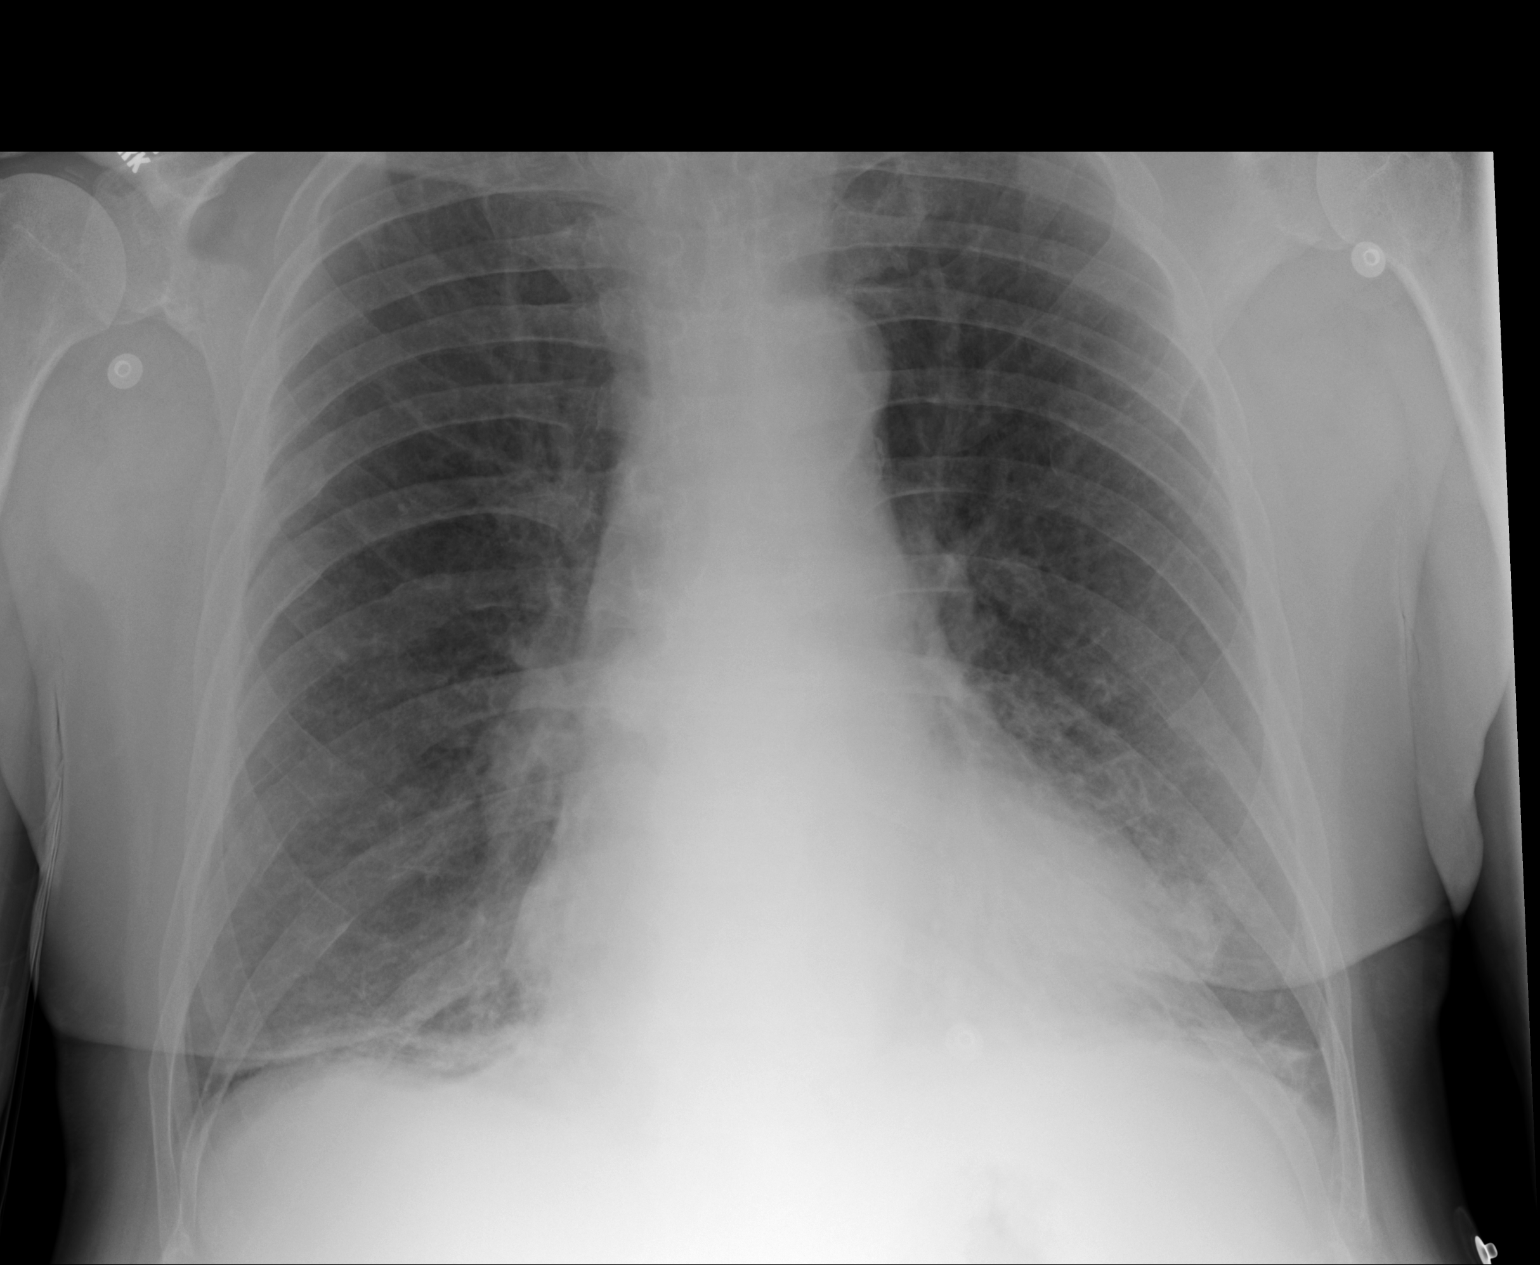

[2 of 2 positions shown; findings below may reference images not displayed]

PROCEDURE:     DXR - DXR PORTABLE CHEST SINGLE VIEW  - March 10, 2012  [DATE]

RESULT:     Comparison is made to the study August 23, 2011.

The lungs are mildly hyperinflated. There are basilar fibrotic changes on
the right which appear stable. There are similar findings on the left which
are more conspicuous than in the past. The cardiac silhouette is top normal
in size, The perihilar lung markings are mildly increased today, and the
interstitial markings also appear slightly increased diffusely. The
mediastinum is normal in width.
IMPRESSION: The findings are consistent with COPD and superimposed mild
CHF with interstitial edema.

[REDACTED]

## 2014-05-05 NOTE — Discharge Summary (Signed)
PATIENT NAME:  [name], [name] MR#:  [id number] DATE OF BIRTH:  1940/04/14  DATE OF ADMISSION:  08/23/2011 DATE OF DISCHARGE:  08/31/2011  PRIMARY CARE PHYSICIAN: Beverely RisenFozia Khan, MD PULMONOLOGIST: Dr Freda MunroSaadat Khan, MD  FINAL DIAGNOSES:  1. Acute on chronic respiratory failure.  2. Chronic obstructive pulmonary disease exacerbation.  3. Chronic diastolic heart failure.  4. Hypotension then hypertension.  5. Gastroesophageal reflux disease.   6. Hyperlipidemia.  7. Acute renal failure on chronic kidney disease stage III.  8. Anxiety.   MEDICATIONS ON DISCHARGE:  1. Aspirin 81 mg p.o. daily. 2. Bystolic 5 mg p.o. daily.  3. Spiriva 1 inhalation daily.  4. Spironolactone 25 mg daily.  5. Omeprazole 40 mg daily.  6. Uribel oral capsule 1 capsule 4 times a day.  7. Atorvastatin 20 mg at bedtime.  8. Vitamin D3 at 2000 international units daily.  9. Gabapentin 300 mg 1 to 3 capsules 1 to 3 times a day. 10. Acetaminophen/hydrocodone 500/5 one capsule every 6 hours as needed for pain. 11. Albuterol 3 mL nebulizer every 6 hours as needed for shortness of breath.  12. Xanax 0.25 mg daily.  13. Vitamin B complex 1 tablet daily.  14. Cyclobenzaprine 10 mg 1 capsule 3 times a day as needed for muscle spasms.  15. Furosemide 20 mg 2 tablets twice a day.  16. Potassium chloride 20 mEq daily.  17. ProAir 2 puffs every 6 hours as needed for shortness of breath.  18. Rapaflo 4 mg 2 capsules once a day with breakfast.  19. Theophylline SR 300 mg daily. 20. Daliresp 500 mcg daily.  21. Lisinopril 10 mg daily.  22. Prednisone taper 10 mg 4 tablets day one, 3 tablets day two, 2 tablets day three, 1 tablet day four and five, 1/2 tablet day six and seven.  23. Advair Diskus 250/50 one inhalation twice a day. 24. Lactulose 30 mL daily as needed for constipation.  25. Budesonide 0.5 per 2 mL inhalation suspension 1 unit nebulizer every 12 hours. 26. Vibramycin 100 mg every 12 hours for 8 days.  27. Oxygen  2L nasal cannula with BiPAP at night.   DIET: Low-sodium diet, regular consistency.   ACTIVITY: Activity as tolerated.   FOLLOWUP: Follow up with Dr. Freda MunroSaadat Khan in 1 week, Dr. Beverely RisenFozia Khan in 1 to 2 weeks.   HOSPITAL COURSE: Patient was admitted August 23, 2011, discharged August 31, 2011.  Came in with shortness of breath, cough, wheezing,   HISTORY OF PRESENT ILLNESS: A 74 year old man with end-stage chronic obstructive pulmonary disease on oxygen. He presented with low blood pressure, acute renal failure, acute on chronic respiratory failure, was started on high dose steroids and antibiotics.  LABORATORY AND RADIOLOGICAL DATA: EKG showed sinus rhythm, possible premature atrial complex. BNP 167.  Troponin negative. Glucose 95, BUN 50, creatinine 2.14, sodium 140, potassium 4.7, chloride 100, CO2 of 31, calcium 9.2. Liver function tests normal. GFR on admission 30. White blood cell count 11.1, hemoglobin 13.6 and hematocrit 40.8, platelet count of 149,000. Chest x-ray showed chronic obstructive pulmonary disease, basilar infiltrates cannot be completely excluded. Abdomen, flat and erect, showed a moderate amount of stool. Blood cultures negative for 5 days. Urinalysis negative. Creatinine upon discharge 1.44.   CONSULTATIONS DURING THE HOSPITAL COURSE: Dr. Belia HemanKasa from Pulmonary, also Physical Therapy.   HOSPITAL COURSE PER PROBLEM LIST:  1. For the patient's acute on chronic respiratory failure the patient was wheezing throughout the entire hospital stay. He was on high-dose steroids  for almost a week, 60 mg IV q.6h. I dropped him down to 40 q.6, and then he had worsening shortness of breath. He was put back on high- dose steroids. Trial of Roxanol was given.  IV Lasix was given. He finished a course of Zithromax and Rocephin during the hospital course, and Zyvox was initially started. He felt better a couple of days later and was ready for discharge home after steroids were tapered a second time. He  was treated for chronic obstructive pulmonary disease exacerbation. He is chronically on oxygen in between 2 and 3 liters, and he does wear a BiPAP at night. He was also treated for chronic diastolic heart failure, and initially his Lasix was held and was given IV fluid secondary to the hypotension but then needed to be diuresed with IV Lasix and then switched over to his usual dose of 40 mg twice a day. His respiratory status improved. He is also on beta-blocker, Bystolic and lisinopril. The patient was sent home on prednisone taper. The Zyvox was very costly, and this was switched over to doxycycline. Patient's respiratory status was back to his baseline. Better air entry and slight wheeze throughout. For the patient's initial hypotension, he was given IV fluids and his blood pressure medications were held and then they had to be restarted when his blood pressure improved.  2. For his gastroesophageal reflux disease, he is on omeprazole.  3. For his hyperlipidemia, he is on atorvastatin.  4. For his acute renal failure on chronic kidney disease, his GFR is 57 upon discharge with a creatinine of 1.44. This had improved with improvement of blood pressure.  5. Anxiety. He is on Xanax.  TIME SPENT ON DISCHARGE: 35 minutes.    ____________________________ Herschell Dimes. Renae Gloss, MD rjw:vtd D: 08/31/2011 16:23:35 ET T: 09/01/2011 13:01:20 ET JOB#: [id number]  cc: Herschell Dimes. Renae Gloss, MD, <Dictator> Lyndon Code, MD Yevonne Pax, MD Salley Scarlet MD ELECTRONICALLY SIGNED 09/01/2011 15:44

## 2014-05-05 NOTE — H&P (Signed)
PATIENT NAME:  [name], [name] MR#:  [id number] DATE OF BIRTH:  08-Mar-1940  DATE OF ADMISSION:  08/23/2011  PRIMARY CARE PHYSICIAN: Ryan RisenFozia Khan, MD  REFERRING PHYSICIAN: Dorothea GlassmanPaul Malinda, MD  CHIEF COMPLAINT: Shortness of breath, cough, and wheezing worsening this morning   HISTORY OF PRESENT ILLNESS: The patient is a 74 year old Caucasian male with a history of hypertension, hyperlipidemia, chronic obstructive pulmonary disease, chronic respiratory failure on home oxygen, benign prostatic hypertrophy, anxiety, gastroesophageal reflux disease, and history of heart failure who presented to the ED with worsening shortness of breath, wheezing, and cough early this morning. In addition, the patient said his nurse found that he had a low blood pressure and then he was sent to the ED for further evaluation. The patient said that he could not lie flat due to shortness of breath, but he denies any fever or chills, no headache or dizziness, no orthopnea or nocturnal dyspnea, no leg edema, and no weight loss or weight gain. The patient said he has leg swelling if not taking a water pill. The patient said he is still smoking 1-1/2 packs a day. The patient was noted to have a low blood pressure in the 70s in the ED and was treated with normal saline bolus and blood pressure increased to 80s.   PAST MEDICAL HISTORY:  1. Hypertension.  2. Chronic obstructive pulmonary disease. 3. Benign prostatic hypertrophy. 4. Hyperlipidemia. 5. Gastroesophageal reflux disease. 6. History of congestive heart failure.  7. Chronic respiratory failure, on home oxygen.   FAMILY HISTORY: Mother and sister have diabetes. Brother had a heart attack.   PAST SURGICAL HISTORY: Prostate surgery   ALLERGIES: Penicillin.  MEDICATIONS:  1. Acetaminophen/hydrocodone 500/5 mg p.o. tablets 1 tablet every six hours p.r.n.  2. Albuterol 2.5 mg/3 mL every 6 hours p.r.n. for shortness of breath. 3. Alprazolam 0.25 mg p.o. daily.  4. Aspirin  81 mg p.o. daily.  5. Atorvastatin 1 tablet p.o. at bedtime.  6. Bystolic 5 mg p.o. daily.  7. Cyclobenzaprine 10 mg p.o. three times daily p.r.n.  8. Daliresp 500 mcg p.o. daily. 9. Lasix 20 mg p.o. 2 tablets twice a day. 10. Gabapentin 300 mg three capsules p.o. 1 to 3 times a day. 11. Lisinopril 10 mg p.o. daily.  12. Omeprazole 40 mg p.o. daily.  13. Potassium 20 mEq p.o. daily.  14. Pro Air HFA 90 mcg inhalation 2 puffs every six hours p.r.n. for shortness of breath. 15. Rapaflo 4 mg capsule 2 capsules once daily with breakfast.  16. Spiriva 18 mcg inhalation capsules one capsule via HandiHaler once a day.  17. Aldactone 20 mg p.o. daily.  18. Theophylline 300 mg capsule one capsule once daily.  19. Uribel one capsule four times daily.  20. Vitamin B complex 100 mg 1 tablet p.o. daily.  21. Vitamin D3, 2000 international unit tablets 1 tablet p.o. daily.   REVIEW OF SYSTEMS: CONSTITUTIONAL: The patient denies any fever or chills. No headache. No dizziness or weakness. No weight loss or weight gain. EYES: No double vision or blurred vision. ENT: No hearing loss, epistaxis, postnasal drip, dysphagia, or slurred speech. RESPIRATORY: Positive for cough, wheezing, and shortness of breath. No hemoptysis. CARDIOVASCULAR: No chest pain, palpitations, orthopnea, or nocturnal dyspnea. No leg edema. GASTROINTESTINAL: No abdominal pain, nausea, vomiting, or diarrhea. No melena  or bloody stool. GU: No dysuria, hematuria, or incontinence. ENDOCRINE: No polyuria, polydipsia, or heat or cold intolerance. HEMATOLOGY: No easy bruising or bleeding. NEUROLOGY: No syncope, loss of consciousness,  or seizure. SKIN: No rash or jaundice. PSYCH: No depression and no anxiety.   PHYSICAL EXAMINATION:   VITALS: Temperature 95.9, blood pressure 89/56, pulse 95, and oxygen saturation 95%.   GENERAL: The patient is alert, awake, and oriented, in no acute distress, sitting on the bed.   HEENT: Pupils are round,  equal, and reactive to light and accommodation. Moist oral mucosa. Clear oropharynx.   NECK: Supple. No JVD or carotid bruit. No lymphadenopathy. No thyromegaly.   CARDIOVASCULAR: S1 and S2, regular rate and rhythm. No murmurs or gallops.   PULMONARY: Bilateral air entry. Severe wheezing. No crackles or rales. No use of accessory muscles to breathe.   ABDOMEN: Soft, highly distended. Bowel sounds present. Difficult to estimate whether the patient has organomegaly. There is diffuse tenderness.   EXTREMITIES: No edema, clubbing, or cyanosis. No calf tenderness.   SKIN: No rash or jaundice.   NEUROLOGY: Alert and oriented x3. No focal deficits. Power 5/5. Sensation intact. Deep tendon reflexes 2+.   LABORATORY, DIAGNOSTIC AND RADIOLOGIC DATA: WBC 11.1, hemoglobin 13.6, and platelets 149. Glucose 95, BUN 50, creatinine 2.14, and  previous BUN and creatinine were within normal range.  Electrolytes are normal. Troponin is less than 0.02. BNP is 167.   Chest x-ray: Chronic obstructive pulmonary disease likely discoid atelectasis within the lung bases. No acute cardiopulmonary disease.  EKG shows sinus rhythm with possible PAC at 91 beats per minute.   IMPRESSION:  1. Hypotension possibly due to low volume secondary to hypertension medication.  2. Acute renal failure and dehydration possibly due to hypertension medication and low volume.  3. Acute on chronic respiratory failure secondary to chronic obstructive pulmonary disease exacerbation.  4. Chronic obstructive pulmonary disease exacerbation.  5. Leukocytosis.  6. History of benign prostatic, congestive heart failure, and anxiety. CHF is stable. 7. Abdominal distention.  PLAN OF TREATMENT:  1. The patient will be admitted to the Critical Care Unit. We will continue oxygen by nasal cannula and telemetry monitoring. We will hold lisinopril, Aldactone, Bystolic, and Rapaflo. We will continue normal saline bolus. If the patient's blood  pressure is stable, we will continue normal saline at 150 mL/Spears and follow up BMP and kidney bladder ultrasound. 2. For chronic obstructive pulmonary disease exacerbation and respiratory failure, we will continue O2 by nasal cannula. Start Solu-Medrol, Xopenex, Spiriva, theophylline, and we will start Zithromax and follow up CBC.  3. For abdominal distention, we will get abdominal x-ray.  4. GI and DVT prophylaxis.  5. For smoking cessation, the patient was counseled.  I discussed with the patient about his situation and the plan of treatment.   TIME SPENT: About 60 minutes.  ____________________________ Shaune Pollack, MD qc:slb D: 08/23/2011 13:46:51 ET T: 08/23/2011 14:58:33 ET JOB#: [id number]  cc: Shaune Pollack, MD, <Dictator> Lyndon Code, MD Shaune Pollack MD ELECTRONICALLY SIGNED 08/23/2011 15:59

## 2014-05-08 NOTE — H&P (Signed)
PATIENT NAME:  [name], [name] MR#:  [id number] DATE OF BIRTH:  1940/12/22  DATE OF ADMISSION:  03/11/2012  PRIMARY CARE PHYSICIAN:  Beverely Risen, MD  REFERRING PHYSICIAN: Maricela Bo, MD  CHIEF COMPLAINT: Increased shortness of breath x 24 hours.   HISTORY OF PRESENT ILLNESS: [name] is a 74 year old Caucasian male with history of chronic obstructive pulmonary disease and ongoing tobacco abuse and history of both systolic and diastolic congestive heart failure with ejection fraction of 40%. The patient was in his usual state of health. He has baseline of exertional dyspnea. However, over the last 24 hours, he became progressively short of breath. He has wheezing. In addition he has dry cough without sputum production. Symptoms worsened to the extent he became extremely short of breath and failing to breathe adequately. The patient was evaluated here at the Emergency Department with findings consistent with chronic obstructive pulmonary disease exacerbation along with decompensated congestive heart failure and interstitial lung edema. The patient denies having any chest pain. No fever although he is unsure. He also denies having any hemoptysis.   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  PAST SURGICAL HISTORY: Prostatic surgery.   FAMILY HISTORY: His brother has coronary artery disease, unsure at what age it started. His mother and sister have diabetes mellitus.   SOCIAL HISTORY: He is divorced, lives at home alone, but he has two aides who comes to help him throughout the day.   SOCIAL HABITS: Chronic smoker. He started smoking at the age  of 67. He used to smoke 3 packs a day when he was working, but now he cut down to about 4 cigarettes a day. 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 No history of other drug abuse.   ADMISSION MEDICATIONS:  1.  Vitamin D3 2000 units day.  2.  Vitamin B complex once a day. 3.  Theophylline 300 mg once a day. 4.  Spironolactone 25 mg a day.  5.  Furosemide 40 mg twice a day. 6.  Lisinopril 10 mg a day. 7.  Omeprazole 40 mg a day. 8.  Potassium chloride 20 milliequivalents a day. 9.  Pro Air HFA age 30 puffs q. 6 hours p.r.n.  10.  Spiriva 1 inhalation once a day. 11.  Fluticasone-Salmeterol 1 puff twice a day. 12.  Daliresp 500 mcg once a day. 13.  Flexeril 10 mg 3 times a day p.r.n.  14.  Bystolic 5 mg once a day.  15.  Budesonide 0.5 mg nebulization twice a day. 16.  Atorvastatin 20 mg a day. 17.  Aspirin 81 mg a day. 18.  Alprazolam 0.25 mg once a day p.r.n.   ALLERGIES:  PENICILLIN CAUSING SKIN RASH.   PHYSICAL EXAMINATION: VITAL SIGNS: Blood pressure 108/61, respiratory rate 24, pulse 113, temperature 97.9, oxygen saturations 93% while he is on oxygen. In fact his O2 saturation is ranging between 91 to 93.  GENERAL APPEARANCE: Elderly male sitting on the stretcher, appears in mild to moderate respiratory distress.  HEAD AND NECK: No pallor. No icterus. No cyanosis.  ENT: Ears reveal normal hearing. No lesions. No ulcers. No discharge. Nasal mucosa was normal. No discharge. No bleeding. Oropharyngeal area  showed no exudate. No oral thrush.  EYES: Normal eyelids and conjunctiva. Pupils about 5 to 6 mm, round. The left pupil is slightly larger than the right. Both reactive to light.  NECK: Supple. Trachea at midline. No thyromegaly. No cervical lymphadenopathy. No masses.  HEART: Distant heart sounds. I could not hear the heart sounds adequately with faint S1 and S2. No murmur was appreciated. No gallop. No carotid bruits.  RESPIRATORY: Slight  tachypnea. The patient is using slightly the accessory muscles. He has prolonged expiratory phase and diffuse bilateral wheezing along with fine crackles at the bases.  ABDOMEN: Obese, distended, soft without tenderness. No hepatosplenomegaly. No masses. No hernias.  SKIN: No ulcers. No subcutaneous nodules.  MUSCULOSKELETAL: No joint swelling. No clubbing.  NEUROLOGIC: Cranial nerves II through XII are intact. No focal motor deficit.  PSYCHIATRY: The patient is alert and oriented x 3. Mood and affect were normal.   LABORATORY FINDINGS AND RADIOLOGIC DATA: His chest x-ray showed the lungs are mildly hyperinflated. There is increased interstitial markings, appears to be consistent with interstitial edema and congestive heart failure. EKG showed sinus bradycardia at rate of 123 per minute. Poor progression of R waves in the anterior chest leads. Unchanged compared to his previous or old EKG. His blood workup showed glucose of 107, B-type natriuretic peptide elevated at 551, BUN 15, creatinine 1, sodium 135 and potassium 4.2. Liver function tests and transaminases were normal. CPK 76. Troponin 0.02. CBC showed white count of 16,000, hemoglobin 13, hematocrit 41 and platelet count 195. His venous ABG showed a pH of 7.40 and pCO2 was 55.   ASSESSMENT: 1.  Acute exacerbation of chronic obstructive pulmonary disease. 2.  Decompensated congestive heart failure. 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  3.  Acute on chronic respiratory failure. The patient is home oxygen dependent, on 2 liters.  4.  Systemic hypertension.  5.  Hyperlipidemia.  6.  Gastroesophageal reflux disease. 7.  Benign prostatic hypertrophy.  8.  Tobacco abuse.   PLAN: We will admit the patient to the medical floor on telemetry monitoring, followup on cardiac enzymes. Intravenous Lasix 40 mg twice a day x 24 hours and then continue on the oral Lasix and  Aldactone. I will also consider repeating his echocardiogram since the last one was in 2011 to update the data and make sure there are no changes. Also will treat him for the chronic obstructive pulmonary disease exacerbation. He received a dose of Levaquin at the Emergency Department. I will continue with that along with DuoNebs q. 4 hours while awake, oxygen supplementation and continue theophylline. Oxygen supplementation 3 liters and continue the    rest of his home medications. The patient needs to quit tobacco abuse. The patient does not have a Living Will, but his code status is FULL CODE.   TIME SPENT: Evaluating this patient took more than 1 hour.  ____________________________ Carney CornersAmir M. Rudene Rearwish, MD amd:sb D: 03/11/2012 00:43:55 ET T: 03/11/2012 07:07:59 ET JOB#: [id number]  cc: Carney CornersAmir M. Rudene Rearwish, MD, <Dictator> Karolee OhsAMIR Dala DockM Eason Housman MD ELECTRONICALLY SIGNED 03/12/2012 2:16

## 2014-05-08 NOTE — Consult Note (Signed)
PATIENT NAME:  [name], [name] MR#:  [id number] DATE OF BIRTH:  07-Apr-1940  DATE OF CONSULTATION:  03/16/2012  REFERRING PHYSICIAN:    CONSULTING PHYSICIAN:  Yevonne PaxSaadat A. Khan, MD  HISTORY OF PRESENT ILLNESS:  The patient is well known to me. He is a 74 year old gentleman who has a history of COPD, ongoing tobacco use. He still says that he is smoking at least 4 to 5 cigarettes per day. He came into the hospital on February 24th with increasing shortness of breath. The patient had been having some cough and some minimal sputum. No hemoptysis was noted. No chest pain was noted. He does have a history of diastolic heart failure with an EF of about 45%. The patient's other past medical history significant for COPD, ongoing tobacco use, systemic hypertension, hyperlipidemia, GERD, chronic respiratory failure. He is on oxygen at home and ongoing tobacco use.   FAMILY HISTORY:  Positive for coronary disease.   SOCIAL HISTORY:  As already mentioned, he smokes and he still does. He does not drink at this time. His last drink was roughly 4 years ago.   MEDICATIONS:  Reviewed on the electronic medical record.   ALLERGIES: PENICILLIN.   REVIEW OF SYSTEMS:  A complete 12-point review of systems was performed and is unremarkable as noted above in HPI.   PHYSICAL EXAMINATION:  VITAL SIGNS:  Temperature was 97.5, pulse 97, respiratory rate 20, blood pressure 130/65, saturations were 94%.  NECK:  Appeared to be supple. There was no JVD. No adenopathy. No thyromegaly.  CHEST:  Diminished breath sounds with some rhonchi. No rales. Expansion was equal.  CARDIOVASCULAR:  S1 and S2 was normal, regular rhythm. No gallop or rub.  ABDOMEN:  Soft, nontender.  EXTREMITIES:  No cyanosis or clubbing. Pulses equal.  NEUROLOGIC: Awake and alert, moving all 4 extremities.  SKIN:  No acute rashes.  MUSCULOSKELETAL:  No active synovitis.  LABORATORY, DIAGNOSTIC, AND RADIOLOGICAL DATA:  The patient's last platelet count  was 225. His admission white count was 16, hemoglobin 13.2, hematocrit 41.3. the chemistries show a BUN 15, creatinine 1.02. The liver enzymes were within normal limits. CPKs were negative. X-ray that was done on admission and shows some COPD with some interstitial edema consistent with heart failure.   IMPRESSION:  1.  Acute exacerbation of chronic obstructive pulmonary disease with acute on chronic respiratory failure.  2.  Diastolic heart failure.  3.  Ongoing tobacco abuse.   PLAN:  Once again I spoke to the patient about smoking cessation. We have tried repeatedly in the office try to get him to stop smoking and unfortunately, he persists. He also has to be more compliant with the use of oxygen. He should be continued on his current medications, treat the diastolic heart failure as you are, and I think the main issue with him really is to get off the cigarettes if he can as this is continuing to cause a decline in his status. Thanks for consulting me in the care of this patient.  ____________________________ Yevonne PaxSaadat A. Khan, MD sak:jm D: 03/16/2012 11:30:57 ET T: 03/16/2012 13:27:01 ET JOB#: [id number]  cc: Yevonne PaxSaadat A. Khan, MD, <Dictator> Yevonne PaxSAADAT A KHAN MD ELECTRONICALLY SIGNED 03/17/2012 11:19

## 2014-05-08 NOTE — Discharge Summary (Signed)
PATIENT NAME:  [name], [name] MR#:  [id number] DATE OF BIRTH:  01-17-1940  DATE OF ADMISSION:  03/11/2012 DATE OF DISCHARGE:  03/18/2012  ADMITTING DIAGNOSIS: Shortness of breath.   DISCHARGE DIAGNOSES: 1.  Acute on chronic shortness of breath due to likely acute chronic obstructive pulmonary disease exacerbation.  2.  Acute on chronic respiratory failure with home oxygen.  3.  Acute on chronic obstructive pulmonary disease exacerbation.  4.  Chronic mixed diastolic systolic congestive heart failure currently compensated. No evidence of acute exacerbation.  5.  Continued tobacco abuse. The patient again was counseled regarding stopping of smoking.  6.  Hypertension.  7.  Chronic pain.  8.  Hyperlipidemia.  9.  Gastroesophageal reflux disease. 10.  Benign prostatic hypertrophy. 11.  Fungal infection involving the feet. The patient treated with antifungal cream.  12. Status post prosthetic surgery.   PERTINENT LABORATORY AND DIAGNOSTIC DATA:  Chest x-ray showed lungs are mildly hyperinflated. There is increased interstitial markings that appeared to be consistent with possible interstitial edema. EKG shows sinus tachycardia at a rate of 123, poor R wave progression. BMP showed a glucose of 107, BNP was 551, BUN 15, creatinine was 1, sodium 0135, potassium 4.2. LFTs were normal. CPK 76. Troponin 0.02. WBC count was 16,000, hemoglobin 13, hematocrit 41, platelet count 195. His TSH was 1.04. Echocardiogram of the heart showed ejection fraction 35% to 45%, mild to moderate global hypokinesis of the left ventricle.  Influenza A and B were negative.   HOSPITAL COURSE:  Please refer to history and physical done by the admitting physician. The patient is a 74 year old white male with likely severe COPD who is on chronic oxygen, who continues to smoke who normally has baseline shortness of breath and presented with worsening shortness of breath. The patient was initially thought to have a possible COPD  exacerbation as well as pockets of acute CHF. However, it was felt that he likely did not have any evidence of CHF.  It was felt more his symptoms were related to acute COPD flare. The patient was treated with nebulizers, IV Solu-Medrol and continued oxygen support. He was slow to improve. Dr. Renae Gloss, who has taken care of the patient in the past felt that he usually did not respond to any treatment and he started him on oral Zyvox. He was also seen in consultation by pulmonology who recommended continuing the current plan. The patient's breathing is improved close to his baseline and he is stable for discharge.   DISCHARGE INSTRUCTIONS: Given for congestive heart failure.   DISCHARGE MEDICATIONS:  Bystolic 5, one tab p.o. daily. Spiriva 18 mcg daily.  Spironolactone 25 p.o. daily. Omeprazole 40 1 tab p.o. daily.  Uribel 1 tab 4 times per day. Acetaminophen/hydrocodone 500/5 one tab p.o. q.6 p.r.n. pain albuterol.  Albuterol/Atrovent nebulizers every 6 hours as needed for shortness of breath. Alprazolam 0.25 once daily as needed. Vitamin B complex 1 tab p.o. daily. Cyclobenazepril 10 mg 1 tab p.o. t.i.d. as needed for muscle spasm.  Lasix 20 two times per day.  KCl 20 mEq daily. ProAir 2 puffs every 6 hours as needed for shortness of breath, Rapaflo 4 mg 2 caps daily with breakfast.  Theophylline 300 mg 1 tab p.o. daily.  Daliresp 500 daily. Lisinopril 10 daily. Fluticasone salmeterol  250/50 1 puff b.i.d.  Budesonide 1 nebulizer every 12 hours,.  Aspirin 81 mg 1 tab p.o. daily.  Atorvastatin 20 daily. Chlorzoxazone 500 one tab p.o. at bedtime as needed for muscle  spasms.  Ammonium lactate topically, apply to foot 2 times a day.  Clotrimazole 1% topically applied to affected area 2 times a day  for 14 days.  Guaifenesin 600 one tab p.o. b.i.d.  Prednisone as needed,.  HOME OXYGEN:  Yes, 3 liters.   DIET: Low sodium, low fat, low cholesterol.   ACTIVITY: As tolerated.   FOLLOWUP: Follow up with Dr. Beverely RisenFozia  Khan in 1 to 2 weeks and Dr. Welton FlakesKhan of pulmonary in 2 to 4 weeks.  TIME SPENT: 35 minutes spent on the discharge.    ____________________________ Lacie ScottsShreyang H. Allena KatzPatel, MD shp:ct D: 03/18/2012 16:25:39 ET T: 03/19/2012 09:08:23 ET JOB#: [id number]  cc: Shreyang H. Allena KatzPatel, MD, <Dictator> Charise CarwinSHREYANG H PATEL MD ELECTRONICALLY SIGNED 03/20/2012 13:07

## 2014-05-09 NOTE — H&P (Signed)
PATIENT NAME:  [name], ISHIDA MR#:  [id number] DATE OF BIRTH:  1941-01-05  DATE OF ADMISSION:  02/25/2013  PRIMARY CARE PHYSICIAN: Dr. Beverely Risen.   REFERRING PHYSICIAN: Dr. Lowella Fairy.   CHIEF COMPLAINT: Shortness of breath.   HISTORY OF PRESENT ILLNESS: Mr. Winkleman is a 74 year old male with history of COPD,  congestive heart failure who had recent admission in January 2015 for COPD exacerbation and congestive heart failure. The patient states he was doing well until one week back, started to have cough, shortness of breath. The shortness of breath has been gradually getting worse. Did not have any fever. This evening patient's shortness of breath significantly worsened. Concerning this, EMS was called and was brought to the Emergency Department. Workup in the Emergency Department, chest x-ray, one view, portable showed a right bibasilar atelectasis. BNP was found to be 900. Per Emergency Department, the patient's initial oxygen saturations were in 80s. Concerning this, the patient was placed on BiPAP. The patient was tachycardic. He received multiple breathing treatments.   PAST MEDICAL HISTORY: 1.  COPD.   2.  Congestive heart failure, systolic with EF of 35 to 45%.  3.  Benign prosthetic hypertrophy.  4.  Hypertension.  5.  Hyperlipidemia.  6.  Gastroesophageal reflex disease.  7.  Continued tobacco use.  8.  History of chronic pain syndrome.  9.  Status post prostate surgery.   ALLERGIES: PENICILLIN.   HOME MEDICATIONS: 1.  Vitamin D 3000 units 1 capsule once a day.  2.  Torsemide 20 mg 2 tablets in the morning.  3.  Theophylline extended release 100 mg once a day.  4.  Bactrim 1 tablet every 12. 5.  Spiriva fever 18 mcg once a day.  6.  ProAir 2 puffs every 4 to 6 hours as needed.  7.  Prednisone 60 mg on tapering dose.  8.  MiraLAX once a day.  9.  Omeprazole 40 mg once a day.  10.  Norco every 6 hours as needed.  11.  Nicotine patch. 12.  Lisinopril 30 mg once a day.   13.  Ipratropium every 4 to 6 hours as needed.  14.  Gabapentin 600 mg 2 times a day.  15.  Flonase 50 mcg once a day.  16.  Ferrous sulfate 1 tablet once a day.  17.  Daliresp 500 mcg once a day.  18.  Clonazepam 0.5 mg 2 times a day.  19.  Cardizem 120 mg once a day.  20.  Atorvastatin 10 mg once a day.  21.  Aspirin 81 mg once a day.  22.  Albuterol every 4 hours as needed.  23.  Advair Diskus 1 puff 2 times a day.   SOCIAL HISTORY: Continues to smoke 1/2 pack a day. Denies drinking alcohol or using illicit drugs. Lives by himself.   FAMILY HISTORY: Brother with coronary artery disease. Mother and sister with diabetes mellitus myringotomy.   REVIEW OF SYSTEMS:  GENERAL: Has been experiencing severe generalized weakness.  CONSTITUTIONAL: No change in vision.  ENT: No tinnitus, hearing loss.  RESPIRATORY: Has cough, shortness of breath.  CARDIOVASCULAR: No chest pain, palpations.   GASTROINTESTINAL: No nausea, vomiting.  GENITOURINARY: No dysuria or hematuria.  ENDOCRINE: No polyuria or polydipsia.  HEMATOLOGIC: No easy bruising or bleeding.   SKIN: No rash or lesions.  MUSCULOSKELETAL: No joint pains and aches.  NEUROLOGIC:  No weakness or numbness in any part of the body.  PSYCHIATRIC: Has history of anxiety.   PHYSICAL  EXAMINATION:  GENERAL: This is a frail-looking male lying down in the bed in mild distress, secondary to shortness of breath.  VITAL SIGNS: Temperature 98.9, pulse 112, blood pressure 155/70, respiratory rate of 24, oxygen saturation is 97% on BiPAP.  HEENT: Head normocephalic, atraumatic. There is no scleral icterus. Conjunctivae normal. Pupils equal and react to light. Extraocular movements are intact. Mucous membranes moist. No pharyngeal erythema.  NECK: Supple. No lymphadenopathy. No JVD. No carotid bruit.  CHEST: Has no focal tenderness. Bilateral diffuse wheezing.  HEART: S1, S2, regular, tachycardia.  ABDOMEN: Bowel sounds present. Soft, nontender,  nondistended abdomen. No hepatosplenomegaly.  EXTREMITIES: 1+ pitting edema.  NEUROLOGIC: The patient is alert, oriented to place, person and time. Cranial nerves II through XII intact. Motor 5/5 in upper and lower extremities.  SKIN: No rash or lesions.  MUSCULOSKELETAL: Good range of motion in all the extremities.   LABORATORY DATA: CMP is completely within normal limits. Troponin 0.03. CBC is completely within normal limits.   ASSESSMENT AND PLAN: Mr Wilkie AyeHorton is a 74 year old male who comes to the Emergency Department with  respiratory failure.  1.  Respiratory failure, acute on chronic. This could be a combination of chronic obstructive pulmonary disease exacerbation and congestive heart failure. Treat underlying chronic obstructive pulmonary disease and congestive heart failure and follow up.   2.  Chronic obstructive pulmonary disease exacerbation: Continue with the breathing treatments, Solu-Medrol.  Keep the patient on Rocephin and Zithromax.  3.  Congestive heart failure. Keep the patient on Lasix 40 mg IV b.i.d. Follow up with his strict ins and outs.  4.  Definitely will involve physical therapy, occupational therapy. 5.  Keep the patient on deep vein thrombosis prophylaxis with Lovenox.   TIME SPENT: 55 minutes.     ____________________________ Susa GriffinsPadmaja Cliford Sequeira, MD pv:NTS D: 02/26/2013 00:06:31 ET T: 02/26/2013 00:40:26 ET JOB#: [id number]  cc: Susa GriffinsPadmaja Izaiah Tabb, MD, <Dictator> Lyndon CodeFozia M. Khan, MD Clerance LavPADMAJA Arbie Reisz MD ELECTRONICALLY SIGNED 03/20/2013 1:22

## 2014-05-09 NOTE — H&P (Signed)
PATIENT NAME:  [name], [name] MR#:  [id number] DATE OF BIRTH:  Jul 12, 1940  DATE OF ADMISSION:  02/03/2013  REFERRING PHYSICIAN: Dr. Corky Downs.   PRIMARY CARE PHYSICIAN: Clayborn Bigness, MD  PRIMARY PULMONOLOGIST: Dr. Humphrey Rolls  CHIEF COMPLAINT: Shortness of breath.   HISTORY OF PRESENT ILLNESS: The patient is a pleasant 74 year old male with history of chronic respiratory failure on 2 liters of oxygen, chronic systolic and diastolic CHF with EF of about 40% to 45% in the past who comes in for shortness. The patient states his shortness breath started on Friday and he has been having a cough without much sputum production, shortness of breath, dyspnea on exertion, and decreased mobility secondary to getting very short of breath. He had previously been told that if you become short of breath increase your oxygen via nasal cannula. He tried that, but he still felt like he could not get enough oxygen. His nebulizers also have not been helping him. He has no sick contacts. He has no fever but occasional chills. He also states that he has gained about 10 pounds in the last month or so. He came into the hospital where he was noted to be tripoding and O2 sat in the high 80s on his oxygen, tachypneic, tachycardic. BiPAP has been started and he states he feels a little better. Hospitalist services were contacted for further evaluation and management.   PAST MEDICAL HISTORY:  1.  Chronic respiratory failure secondary to COPD and CHF.  2.  Ongoing tobacco abuse.  3.  Combined systolic and diastolic CHF with echo from February 2014 showing EF of 35% to 45%. 4.  BPH. 5.  Hypertension. 6.  Hyperlipidemia. 7.  GERD. 8.  Chronic pain status post prostate surgery.   FAMILY HISTORY: Brother with CAD. Mom and sister with diabetes.   SOCIAL HISTORY: Still smokes 1/2 pack a day, used to be a 3 pack a day smoker. He has quit alcohol. No drug use.   ALLERGIES: PENICILLIN.   OUTPATIENT MEDICATIONS:  1.  Advair 250/50 mcg one  puff 2 times a day. 2.  Albuterol nebs every 4 to 6 hours as needed. 3.  Aspirin 81 mg daily. 4.  Atorvastatin 10 mg daily. 5.  Cardizem CD 120 mg once a day. 6.  Clonazepam 0.5 mg 2 times a day.  7.  Colace 100 mg 2 times a day. 8.  Daliresp 500 mcg daily. 9.  Ferrous sulfate 325 mg daily. 10.  Flonase 50 mcg once a day spray to nose. 11.  Gabapentin 600 mg 3 times a day. 12.  Ipratropium 1 vial every 4 to 6 hours as needed. 13.  Lisinopril 30 mg daily.  14.  Norco 325/5 mg every 6 hours as needed. 15.  Omeprazole 40 mg daily. 16.  Potassium chloride 10 mEq once a day. 17.  ProAir p.r.n. 18.  Spiriva 18 mcg inhaled 1 cap daily. 19.  Spironolactone 25 mg daily. 20.  Theophylline extended-release 100 mg once a day. 21.  Torsemide 20 mg 2 tabs in the morning and 1 tab in the afternoon. 22.  Vitamin D 3000 units once a day.   REVIEW OF SYSTEMS:  CONSTITUTIONAL: Positive for about 10 pounds of weight gain, shortness of breath. No fevers or chills.  EYES: No blurry vision or double vision.  ENT: No tinnitus or hearing loss. Occasional postnasal drip.  RESPIRATORY: Positive for dry cough, wheezing, shortness of breath, dyspnea on exertion. Has chronic respiratory failure and COPD. CARDIOVASCULAR: No chest  pain. He has had increased swelling in the legs. He has orthopnea. He has been unable to lie flat since Friday. No palpitations.  GASTROINTESTINAL: Nausea without vomiting, diarrhea, or abdominal pain. He states he has had dark stools for the last couple of days.  GENITOURINARY: Denies dysuria, hematuria. Has BPH. ENDOCRINE: Denies polyuria, nocturia.  HEMATOLOGIC AND LYMPHATIC: No anemia or easy bruising.  SKIN: No rashes.  MUSCULOSKELETAL: Denies arthritis or gout. NEUROLOGIC: No focal weakness or numbness.  PSYCHIATRIC: Has anxiety.  PHYSICAL EXAMINATION: VITAL SIGNS: Temperature on arrival 98.7, pulse rate 132, respiratory rate 24, blood pressure 155/83, O2 sat per nurse was 88%  on his nasal cannula. Currently he is on BiPAP, 10/5. Respiratory rate is 23. He is on 45% FiO2. GENERAL: The patient is an ill appearing male sitting in bed, tachypneic, tachycardic.  HEENT: Normocephalic, atraumatic. Pupils are equal and reactive. Anicteric sclerae. Dry mucous membranes.  NECK: Supple. No thyroid tenderness and no cervical lymphadenopathy but BiPAP limits examination of the neck.  CARDIOVASCULAR: Tachycardic, irregularly irregular. No significant murmurs appreciated, but auscultation of the heart sounds is overshadowed by his lung sound which exhibit diffuse coarse wheezing in all fields with diminished air movement.  ABDOMEN: Soft, nontender, and nondistended. Positive bowel sounds in all quadrants.  EXTREMITIES: No significant pitting edema.  SKIN: No obvious rashes.  NEUROLOGIC: Cranial nerves II through XII grossly intact. Strength is 5/5 in all extremities. Sensation is intact to light touch. PSYCHIATRIC: Awake, alert, oriented, pleasant, and cooperative.  LABORATORY AND IMAGING: X-ray of the chest: Possible CHF but superimposed on COPD. BNP 2107. BUN 20, creatinine 0.99, sodium 137, potassium 4.1, glucose 103, total protein 7.7, albumin 3.3, bilirubin 8.8, alk phos 84, AST 7. Troponin negative. White count 9.1, hemoglobin 14, platelets 167.   Initial ABG: PH 7.37, pCO2 62, pO2 56.  Second ABG: PH 7.33, pCO2 68, pO2 73.   EKG: Rate is 129. Right bundle branch block and anterior Q waves. I do not see any overt ST elevations.   ASSESSMENT AND PLAN: We have a 74 year old male with chronic respiratory failure, chronic systolic and diastolic congestive heart failure with ejection fraction of about 35% to 45% who comes in with shortness of breath and noted to be tachypneic, tachycardic, and tripoding with air hunger, currently on BiPAP. The patient's acute on chronic respiratory failure is likely secondary to acute chronic obstructive pulmonary disease exacerbation with mild acute  on chronic systolic and diastolic congestive heart failure.   In regards to the acute chronic obstructive pulmonary disease flare, would continue the BiPAP as this has improved the patient's symptoms. His heart rate has come down to the low 100s. He feels more comfortable on the BiPAP. I would go ahead and obtain another ABG as the second ABG shows decreased pH compared to the first. Start the patient on azithromycin as well as standing IV steroids and standing nebulizers, resumption of his Spiriva and theophylline. I would check a theophylline level and obtain a sputum culture.   In regards to his acute or chronic systolic and diastolic congestive heart failure, I would hold the torsemide p.o. and give the patient IV Lasix. The patient does have elevated BNP and some congestion on x-ray of the chest with weight gain of about 10 pounds per patient. I would continue the ACE inhibitor and Cardizem as well as oxygen at this point and monitor his ins and outs. He has an echo from last year showing ejection fraction of about 35% to 45%.  He is an active smoker still and he was counseled for 3 minutes by me. He states it is difficult to quit. He has not tried a nicotine patch as an outpatient as he cannot afford it. I would start him on high-dose nicotine patch here and see how he does and see if perhaps we can help him get a nicotine patch as an outpatient. I would continue his proton pump inhibitor for gastroesophageal reflux disease, his blood pressure medications as well.   He states he has black stools for a couple of days. Of note, he is on iron and hemoglobin is stable. I would check guaiac stool. He states he has had a colonoscopy in the last few days and some polyps were removed. I would start him on deep vein prophylaxis with SCDs and TEDs at this point. The patient is FULL code.   TOTAL CRITICAL CARE TIME SPENT: 55 minutes.  ____________________________ Vivien Presto, MD sa:sb D: 02/03/2013  11:01:45 ET T: 02/03/2013 11:56:42 ET JOB#: [id number]  cc: Vivien Presto, MD, <Dictator> Lavera Guise, MD Allyne Gee, MD Karel Jarvis [facility] MD ELECTRONICALLY SIGNED 03/01/2013 10:53

## 2014-05-09 NOTE — Consult Note (Signed)
   Comments   Dr Phifer and I met with pt and his CAPS worker, Opal Sidles. Plan is for patient to discharge home this afternoon. He would agree with hospice services as long as it does not interfer with continuation of the CAPS program. He would like to appoint Opal Sidles has his HCPOA and we will try to coordinate this via the chaplain prior to discharge. We also discussed code status again. Pt says he would not want to be intubated or resuscitated and agrees with DNR. Opal Sidles says she agrees with this decision. Code status changed to DNR and portable form placed in chart. Hospice screening is pending.   Electronic Signatures: Kam Rahimi, Kirt Boys (NP)  (Signed 27-Apr-15 19:20)  Authored: Palliative Care   Last Updated: 27-Apr-15 19:20 by Irean Hong (NP)

## 2014-05-09 NOTE — Consult Note (Signed)
PATIENT NAME:  Georgina PeerHORTON, Miller H MR#:  [id number] DATE OF BIRTH:  Feb 16, 1940  DATE OF CONSULTATION:  02/28/2013  REFERRING PHYSICIAN:   CONSULTING PHYSICIAN:  Yevonne PaxSaadat A. Khan, MD  HISTORY OF PRESENT ILLNESS: This is a 74 year old Caucasian male who has past medical history significant for severe COPD, oxygen dependent. He continues to smoke. He was last admitted in January for similar presentation. He was seen in the office, had been doing well, however then started developing increasing cough and shortness of breath. He had no fevers noted. Denied any chills. He came into the Emergency Room and there were some areas of atelectasis noted on his chest x-ray and he was significantly hypoxic with saturations in the 80s. Due to the desats and acute respiratory failure, he was admitted.   PAST MEDICAL HISTORY: Significant for acute on chronic respiratory failure, congestive heart failure, hypertension, hyperlipidemia, gastroesophageal reflux, tobacco abuse, and chronic pain syndrome.   ALLERGIES: PENICILLIN.   SOCIAL HISTORY: Positive for tobacco. No alcohol or drug use.   FAMILY HISTORY: Positive for coronary artery disease.   MEDICATIONS: Reviewed on the electronic medical record.   REVIEW OF SYSTEMS: A complete 12 point review was performed and is unremarkable, other than what is noted above in the HPI.   PHYSICAL EXAMINATION:  VITAL SIGNS: Temperature 97.8, pulse 79, respiratory rate 18, blood pressure 102/59, and saturation 98%.  NECK: Supple. There was no JVD, no adenopathy, no thyromegaly.  CHEST: Coarse breath sounds and rhonchi. No rales. Expansion equal.  CARDIOVASCULAR: S1 and S2 normal. Regular rhythm. No gallop or rub.  ABDOMEN: Soft, nontender.  NEUROLOGIC: Awake and alert, moving all 4 extremities. Had a nonfocal examination.  SKIN: Without any acute rashes. MUSCULOSKELETAL: Without any active synovitis.   LABORATORY AND DIAGNOSTICS: Chest x-ray showed no acute infiltrate. There  were some areas of atelectasis which are present.   Chemistries: BUN was 40, creatinine 1.17, sodium 137, potassium 4.3, and serum CO2 37. The patient's white count on admission was 8.4, hemoglobin 14.2, and hematocrit 44.2.   IMPRESSION:  1.  Acute on chronic respiratory failure.  2.  Chronic obstructive pulmonary disease with acute exacerbation.  3.  Ongoing tobacco abuse.   PLAN: The patient is going to be continued with full support on oxygen therapy. Again, I discussed with him importance of tobacco cessation. He says that he has now been trying to use nicotine gum and this has been helping him to some extent. We will continue to encourage him to stop smoking. Continue with pulmonary toilet, supportive care, and we will follow. Further recommendations as necessary. Overall prognosis is guarded.  ____________________________ Yevonne PaxSaadat A. Khan, MD sak:sb D: 02/28/2013 09:39:24 ET T: 02/28/2013 10:01:20 ET JOB#: [id number]  cc: Yevonne PaxSaadat A. Khan, MD, <Dictator> Yevonne PaxSAADAT A KHAN MD ELECTRONICALLY SIGNED 04/10/2013 14:26

## 2014-05-09 NOTE — H&P (Signed)
PATIENT NAME:  [name], [name] MR#:  [id number] DATE OF BIRTH:  August 25, 1940  DATE OF ADMISSION:  05/07/2013  PRIMARY CARE PROVIDER:  Dr. Clayborn Bigness  EMERGENCY DEPARTMENT REFERRING PHYSICIAN:  Dr. Reita Cliche.   CHIEF COMPLAINT: Shortness of breath.   HISTORY OF PRESENT ILLNESS: The patient is a 74 year old white male with history of COPD, congestive heart failure, who has chronic respiratory failure; last admission in February 2015 for acute on chronic COPD exacerbation, as well as acute on chronic CHF exacerbation who continues to smoke, presents with shortness of breath and cough. The patient reports that he started feeling short of breath last week and coughing, was seen by Dr. Humphrey Rolls last week and was treated with p.o. prednisone and p.o. Levaquin; however, his breathing continued to get worse. The patient came to the ED, was tachypneic and very short of breath. Had received a few treatments, still not feeling well; therefore, we are asked to admit the patient. He reports no fevers or chills. No chest pains. Does complain of swelling of his lower extremity. He denies any abdominal pain, nausea, vomiting or diarrhea. Complains of constipation.   PAST MEDICAL HISTORY: 1.  Chronic COPD with chronic respiratory failure, on 2 liters of oxygen which was increased to 3 liters last week.  2.  Chronic systolic congestive heart failure with EF of 35% to 45%.  3.  BPH. 4.  Hypertension.  5.  Hyperlipidemia.  6.  GERD. 7.  Continued tobacco use.  8.  Feet neuropathy.  9.  Status post prostate surgery.  10.  Anxiety disorder.   ALLERGIES: PENICILLIN.   CURRENT MEDICATIONS AT HOME: Acetaminophen/hydrocodone 325/5 three times a day as needed for pain, Advair 250/50 one puff b.i.d., albuterol inhalation every 4 to 6 hours p.r.n., aspirin 81 one tab p.o. daily, atorvastatin 10 daily, Cardizem CD 120 daily, clonazepam 0.5 one tab p.o. b.i.d., Colace 100 one tab p.o. b.i.d., Daliresp  500 one tab p.o. daily, iron  sulfate 325 daily, Flonase 50 mcg daily, gabapentin 300 two tabs t.i.d., ipratropium inhalation q.4 to 6 p.r.n. for shortness of breath, Levaquin 750 one tab p.o. daily, lisinopril 30 one tab p.o. daily, omeprazole 40 daily, KCl 10 mEq daily, ProAir 2 puffs q.4 to 6 p.r.n., Spiriva 18 mcg daily, spironolactone 25 mg daily, theophylline extended-release 100 daily, torsemide 40 mg daily in the morning, torsemide 20 in the afternoon, vitamin D3 1000 international units daily.   SOCIAL HISTORY: Continues to smoke half pack per day, denies drinking alcohol or drug use, lives by himself. Has a CNA visiting him 7 days a week.   FAMILY HISTORY: Brother with coronary artery disease. Mother and sister with diabetes.   REVIEW OF SYSTEMS:  CONSTITUTIONAL: Denies any fevers. Complains of fatigueness, weakness, chronic pain in the feet. No weight loss. No weight gain.  EYES: No blurred or double vision. No pain. No redness. No inflammation. No glaucoma. No cataracts.  ENT: No tinnitus. No ear pain. No hearing loss. No seasonal or year-round allergies. No epistaxis. No difficulty swallowing.  RESPIRATORY: Complains of cough, wheeze, hemoptysis. No history of chronic COPD.  CARDIOVASCULAR: Denies any chest pain, complaints of edema. No arrhythmia. No syncope.  GASTROINTESTINAL: No nausea, vomiting, diarrhea. No abdominal pain. No hematemesis. No melena.  GENITOURINARY: Denies any dysuria, hematuria, renal calc or frequency.  ENDOCRINE: Denies any polyuria, nocturia. HEMATOLOGIC AND LYMPHATIC: Denies anemia, easy bruisability or bleeding.  SKIN: No acne. No rash. No changes in mole.  MUSCULOSKELETAL: No pain in neck,  back or shoulder.  NEUROLOGIC: No numbness, CVA, TIA.  PSYCHIATRIC: Has anxiety. No insomnia. No ADD.   PHYSICAL EXAMINATION: VITAL SIGNS: Temperature 97.9, pulse 117, respirations 13, blood pressure 137/83, O2 92% on 2 liters.  GENERAL: The patient is a chronically ill-appearing male with mild  respiratory distress.  HEENT: Head atraumatic, normocephalic. Pupils equally round, reactive to light and accommodation. There is no conjunctival pallor. No scleral icterus. Nasal exam shows no drainage or ulceration.  Oropharynx is clear without any exudate.  NECK: Supple without any JVD.  CARDIOVASCULAR: Regular rate and rhythm. Tachycardic. No murmurs, rubs, clicks or gallops.  LUNGS: He has bilateral accessory muscle usage, as well as bilateral wheezing. There is no rhonchi or crackles.  ABDOMEN: Soft, nontender, nondistended. Positive bowel sounds x 4.  EXTREMITIES: He has 1+ edema.  SKIN: No rash.  LYMPHATICS: No lymph nodes palpable.  VASCULAR: Good DP, PT pulses.  PSYCHIATRIC: Not anxious or depressed. NEUROLOGIC:  Awake, alert, oriented x 3. No focal deficits.   LABORATORY DATA: Glucose 103, BUN 19, creatinine 0.84. Sodium 141, potassium 4.3, chloride 99. CO2 is 41, calcium 9.2. LFTs: Total protein 6.3, albumin 3.3, bili total 0.5, alk phos 69, AST 14. Troponin 0.07. CPK 33. CK-MB 2.6. WBC 10.6, hemoglobin 12.9.  Chest x-ray shows cardiomegaly, pulmonary venous hypertension, small right pleural effusion.   EKG: Sinus tachycardia with nonspecific ST and T wave changes.   ASSESSMENT AND PLAN: The patient is a 74 year old white male with history of chronic respiratory failure. Has history of chronic obstructive pulmonary disease, congestive heart failure, presents with shortness of breath.  1.  Acute on chronic respiratory failure due to acute on chronic obstructive pulmonary disease flare. At this time, we will treat him with nebulizers, IV Solu-Medrol and IV Levaquin. We will ask his pulmonologist to see. We will continue his theophylline and Advair and Spiriva as taking at home.  2.  Chronic systolic congestive heart failure, currently compensated. We will continue torsemide as taking at home.  3.  Hypertension. Continue lisinopril.  4.  Hyperlipidemia. Continue atorvastatin.  5.   Gastroesophageal reflux disease. He is on proton pump inhibitor, which we will continue.  6.  Nicotine addiction. The patient counseled regarding smoking cessation, 4 minutes spent. I strongly recommended he stop smoking. We will start him on a nicotine patch.  7.  Miscellaneous. The patient will be on Lovenox for deep vein thrombosis prophylaxis.  TIME SPENT ON THIS PATIENT:  50 minutes.    ____________________________ Lafonda Mosses Posey Pronto, MD shp:ce D: 05/07/2013 14:13:50 ET T: 05/07/2013 15:19:52 ET JOB#: [id number]  cc: Cranston Koors H. Posey Pronto, MD, <Dictator> Alric Seton MD ELECTRONICALLY SIGNED 05/08/2013 13:51

## 2014-05-09 NOTE — Consult Note (Signed)
General Aspect [name] is a pleasant 74 year old gentleman with end-stage COPD, on chronic oxygen, long history of smoking who continues to smoke,  history of obesity, obstructive sleep apnea by his report, chronic muscle spasms in his chest, coronary artery disease,  renal dysfunction and diastolic CHF  presenting for worsening  shortness of breath and cough. SOB. Cardiology was consulted for atrial fibrillation and SOB.   he started feeling short of breath last week and coughing, was seen by Dr. Humphrey Spears and was treated with p.o. prednisone and p.o. Levaquin; however, his breathing continued to get worse. The patient came to the ED, was tachypneic and very short of breath. Had received a few treatments, still not feeling well;   No chest pains. Does complain of swelling of his lower extremity. He denies any abdominal pain, nausea, vomiting or diarrhea. Complains of constipation.  At baseline, his breathing is poor, very short with any exertion. He uses a motorized scooter most of the time.   He is smoking more than 1/2 pd on a regular basisi. He is unable to walk far secondary to neuropathy of the lower extremities. No significant recent leg edema.   hospitalization at [facility] last year.  Cardiac catheterization was performed October 11 2010 showed mild proximal left circumflex disease, moderate mid left circumflex disease, mild proximal LAD disease, ejection fraction 45-50%, severe right internal iliac arterial disease ( 30% diffuse proximal LAD disease, 40% proximal left circumflex, 50% diffuse mid left circumflex after the OM 2)  Medical management was recommended   Admission to the hospital August 2013 for worsening shortness of breath, wheezing and cough. Blood pressure was low. He was started on high-dose prednisone, antibiotics with improvement. He did have diastolic CHF and was treated with Lasix.  last admission in February 2015 for acute on chronic COPD exacerbation, as well as acute  on chronic CHF exacerbation   Present Illness . LHC 09/2010: 30% pLAD, 40% pLCx, 50% mLCx dz, mild pLAD dz; EF 45-50%. Severe right internal iliac arterial disease. Medical management was recommended   2D echo 2/12014: EF 35-45%, moderate LV dilatation, mild concentric LVH, mild-mod global LV HK, normal RV function.  SOCIAL HISTORY: Continues to smoke 1/2 pack a day. Denies drinking alcohol or using illicit drugs. Lives by himself.   FAMILY HISTORY: Brother with coronary artery disease. Mother and sister with diabetes mellitus myringotomy.   Physical Exam:  GEN critically ill appearing   HEENT hearing intact to voice, moist oral mucosa   NECK supple    RESP postive use of accessory muscles  wheezing    CARD Regular rate and rhythm  No murmur    ABD denies tenderness  soft    LYMPH negative neck   EXTR negative edema   SKIN normal to palpation   NEURO motor/sensory function intact   PSYCH alert, A+O to time, place, person, good insight   Review of Systems:  Subjective/Chief Complaint very SOB with any exertion, can't walk   General: Fatigue  Weakness    Skin: No Complaints    ENT: No Complaints    Eyes: No Complaints    Neck: No Complaints    Respiratory: Short of breath  Wheezing    Cardiovascular: Dyspnea    Gastrointestinal: No Complaints    Genitourinary: No Complaints    Vascular: No Complaints    Musculoskeletal: No Complaints    Neurologic: No Complaints    Hematologic: No Complaints    Endocrine: No Complaints  Psychiatric: No Complaints    Review of Systems: All other systems were reviewed and found to be negative    Medications/Allergies Reviewed Medications/Allergies reviewed      chf:    Enlarged Heart:    Ulcers, Gastric:    heart caths:    emphysema:    HTN:    COPD:    ETOH ABUSE:        Admit Diagnosis:   ACUTE RESPITORY FAILURE: Onset Date: 07-May-2013, Status: Active, Description: ACUTE RESPITORY  FAILURE  Home Medications:  Medication Instructions Status  Levaquin 750 mg oral tablet 1 tab(s) orally once a day for 10 days Active  albuterol 2.5 mg/3 mL (0.083%) inhalation solution 3 milliliter(s) inhaled every 4-6 hours, As Needed- for Shortness of Breath  Active  Daliresp 500 mcg oral tablet 1 tab(s) orally once a day Active  Aspirin Enteric Coated 81 mg oral delayed release tablet 1 tab(s) orally once a day Active  potassium chloride 10 mEq oral capsule, extended release 1 cap(s) orally once a day Active  atorvastatin 10 mg oral tablet 1 tab(s) orally once a day (at bedtime) for high cholesterol Active  Vitamin D3 1000 intl units oral capsule 1 cap(s) orally once a day Active  theophylline extended release 100 mg oral tablet, extended release 1 tab(s) orally once a day Active  ferrous sulfate 325 mg oral tablet 1 tab(s) orally once a day for iron deficiency Active  Advair Diskus 250 mcg-50 mcg inhalation powder 1 puff(s) inhaled 2 times a day Active  Flonase 50 mcg/inh nasal spray 1 spray(s) nasal once a day Active  gabapentin 300 mg oral capsule 2 cap(s) orally 3 times a day Active  clonazePAM 0.5 mg oral tablet 1 tab(s) orally 2 times a day Active  Cardizem CD 120 mg/24 hours oral capsule, extended release 1 cap(s) orally once a day for blood pressure Active  Colace sodium 100 mg oral capsule 1 cap(s) orally 2 times a day Active  ipratropium 500 mcg/2.5 mL inhalation solution 2.5 milliliter(s) inhaled every 4 to 6 hours, As Needed - for Shortness of Breath Active  lisinopril 30 mg oral tablet 1 tab(s) orally once a day (in the morning) for blood pressure Active  omeprazole 40 mg oral delayed release capsule 1 cap(s) orally once a day (in the morning) for heartburn Active  ProAir HFA 90 mcg/inh inhalation aerosol 2 puff(s) inhaled every 4 to 6 hours, As Needed - for Shortness of Breath Active  Spiriva 18 mcg inhalation capsule 1 each inhaled once a day Active  spironolactone 25 mg  oral tablet 1 tab(s) orally once a day Active  torsemide 20 mg oral tablet 2 tab(s) orally once a day (in the morning) Active  torsemide 20 mg oral tablet 1 tab(s) orally once a day in the afternoon around 2-3 pm Active  acetaminophen-HYDROcodone 325 mg-5 mg oral tablet 1 tab(s) orally 3 times a day for pain Active   Lab Results:  Hepatic:  22-Apr-15 10:28   Bilirubin, Total 0.5  Alkaline Phosphatase 69 (45-117 NOTE: New Reference Range 12/06/12)  SGPT (ALT) 14  SGOT (AST)  14  Total Protein, Serum  6.3  Albumin, Serum  3.3  Routine Chem:  22-Apr-15 10:28   Glucose, Serum  103  BUN  19  Creatinine (comp) 0.84  Sodium, Serum 141  Potassium, Serum 4.3  Chloride, Serum 99  CO2, Serum  41  Calcium (Total), Serum 9.2  Osmolality (calc) 284  eGFR (African American) >60  eGFR (Non-African  American) >60 (eGFR values <33m/min/1.73 m2 may be an indication of chronic kidney disease (CKD). Calculated eGFR is useful in patients with stable renal function. The eGFR calculation will not be reliable in acutely ill patients when serum creatinine is changing rapidly. It is not useful in  patients on dialysis. The eGFR calculation may not be applicable to patients at the low and high extremes of body sizes, pregnant women, and vegetarians.)  Result Comment CO2 - RESULTS VERIFIED BY REPEAT TESTING.  - NOTIFIED OF CRITICAL VALUE  - CALLED TO JENNIFER WHITLEY @ 13151 - ON 05/07/13-TMS  - READ-BACK PROCESS PERFORMED.  Result(s) reported on 07 May 2013 at 11:09AM.  Anion Gap  1  Cardiac:  22-Apr-15 10:28   CK, Total  33 (39-308 NOTE: NEW REFERENCE RANGE  02/17/2013)  CPK-MB, Serum 2.6 (Result(s) reported on 07 May 2013 at 11:09AM.)  Troponin I  0.07 (0.00-0.05 0.05 ng/mL or less: NEGATIVE  Repeat testing in 3-6 hrs  if clinically indicated. >0.05 ng/mL: POTENTIAL  MYOCARDIAL INJURY. Repeat  testing in 3-6 hrs if  clinically indicated. NOTE: An increase or decrease  of 30% or more on  serial  testing suggests a  clinically important change)  Routine Hem:  22-Apr-15 10:28   WBC (CBC) 10.6  RBC (CBC)  4.38  Hemoglobin (CBC)  12.9  Hematocrit (CBC) 41.4  Platelet Count (CBC) 155 (Result(s) reported on 07 May 2013 at 10:42AM.)  MCV 94  MCH 29.5  MCHC  31.2  RDW  17.2   EKG:  Interpretation EKG show sinus tachycardia with APCs, RBBB   Radiology Results: XRay:    22-Apr-15 11:00, Chest 1 View AP or PA  Chest 1 View AP or PA   REASON FOR EXAM:    Shortness of Breath  COMMENTS:   May transport without cardiac monitor    PROCEDURE: DXR - DXR CHEST 1 VIEWAP OR PA  - May 07 2013 11:00AM     CLINICAL DATA:  Shortness of breath. Cardiomegaly. Gastric ulcers.  Emphysema. Hypertension.    EXAM:  CHEST - 1 VIEW    COMPARISON:  02/25/2013    FINDINGS:  Moderately enlarged cardiopericardial silhouette. New blunting of  the right lateral costophrenic angle. Upper zone pulmonary vascular  prominence. Atherosclerotic aortic arch.     IMPRESSION:  1. Cardiomegaly and pulmonary venous hypertension.  2. Small right pleural effusion.      Electronically Signed    By: WSherryl BartersM.D.    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  1. A/C respiratory failure Multifactorial secondary to A/COPD and A/C combined CHF. Continues to c/o dyspnea, Wheezy and rhoncherous on exam.  -- Continue O2, steroids,  abx, nebs and diuresis  2. A/C combined CHF Prior EF 35-45%.  Initial TnI WNL. Denies chest pain  Suspect hypoxia, COPD exacerbation contributed to decompensation.  EKG- no evidence of ischemia, sinus tachycardia w/ multiple PACs. some abdominal distention --would continue Lasix 47m IV BID monitor renal function  3. CAD Nonobstructive by 2012 cath. No chest pain or ischemic EKG changes. Initial TnI WNL.  4) Arrhythmia: no evidence of atrial fib EKG shows sinus tach with APCs   Plan . 5. PVD Noted on prior cath. Peripheral neuropathy and venous stasis changes noted on exam.  -- Continue ASA, statin  5. Sinus tachycardia, frequent ectopy Noted on EKG. Patient is at risk for atrial arrhythmias- likely secondary to severe underlying lung disease on cardizem  6. COPD/ongoing tobacco abuse end satge disease -- Continue management per primary team -- Tobacco cessation   Electronic Signatures: GIda Rogue(MD)  (Signed 22-Apr-15 18:31)  Authored: General Aspect/Present Illness, History and Physical Exam, Review of System, Past Medical History, Health Issues, Home Medications, Labs, EKG , Radiology, Allergies, Vital Signs/Nurse's Notes, Impression/Plan   Last Updated: 22-Apr-15 18:31 by GIda Rogue(MD)

## 2014-05-09 NOTE — Consult Note (Signed)
General Aspect Mr. 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  LHC 09/2010: 30% pLAD, 40% pLCx, 50% mLCx dz, mild pLAD dz; EF 45-50%. Severe right internal iliac arterial disease. Medical management was recommended   2D echo 2/12014: EF 35-45%, moderate LV dilatation, mild concentric LVH, mild-mod global LV HK, normal RV function.  He has multiple admissions for A/COPD and A/C combined CHF, most recently 1/19 to 02/10/13. Felt more to be COPD related at that time, but was diuresed as well. After that admission, he was stable for the first few days, but then noticed a progressive respiratory decline- increased work of breathing, cough productive of yellow sputum, PND and orthopnea. He did note 3 lbs weight gain and bilateral ankle swelling. He attempted to increase home O2 from 2 to 3 L via n/c without improvement. He continues to smoke (reports smoking 5 cigarettes since discharge). Denies chest pain. He symptoms continued to worsen and he presented to the ED for further evaluation.   Present Illness In the ED, CXR revealed COPD, cardiomegaly and vascular congestion. BNP 900. Initial TnI WNL. Afebrile, no leukocytosis. He was admitted by the medicine, started on steroids, nebs, abx and Lasix IV.  I/O - 1575 mL.   Echo performed. Preliminary finding- reduced EF, ~25%.  PAST MEDICAL HISTORY: 1.  COPD.   2.  Congestive heart failure, systolic with EF of 35 to 45%.  3.  Benign prosthetic hypertrophy.  4.  Hypertension.  5.  Hyperlipidemia.  6.  Gastroesophageal reflex disease.  7.  Continued tobacco use.  8.  History of chronic pain syndrome.  9.  Status post prostate surgery.   ALLERGIES: PENICILLIN.   SOCIAL HISTORY: Continues  to smoke 1/2 pack a day. Denies drinking alcohol or using illicit drugs. Lives by himself.   FAMILY HISTORY: Brother with coronary artery disease. Mother and sister with diabetes mellitus myringotomy.   Physical Exam:  GEN obese, disheveled, Chronically ill-appearing   HEENT pink conjunctivae, PERRL, hearing intact to voice, poor dentition   NECK supple  No masses  trachea midline  mild JVD; no bruits   RESP postive use of accessory muscles  wheezing  rhonchi  shallow inspiration, prolong expiration, expiratory wheeze, scattered rhonchi   CARD Regular rate and rhythm  Normal, S1, S2  No murmur  frequent ectopy; distant heart sounds.   ABD distended  Hepatic enlargement (not pulsatile)   EXTR positive edema, trace with chronic stasis dermatitis   NEURO follows commands, motor/sensory function intact   PSYCH A+O to time, place, person, poor insight   Review of Systems:  Subjective/Chief Complaint Shortness of breath   General: Trouble sleeping  Notes fluctuating weight - he does not check @ home    ENT: No Complaints    Eyes: No Complaints    Cardiovascular: Palpitations  Dyspnea  Orthopnea  Edema    Gastrointestinal: No Complaints    Vascular: No Complaints    Neurologic: Bilateral Feet PN    Hematologic: Ease of bruising    Review of Systems: All other systems were reviewed and found to be negative    Medications/Allergies Reviewed Medications/Allergies reviewed    Home Medications: Medication Instructions Status  predniSONE 10 mg oral tablet 6 tab(s) orally once a day x 3 days 5  tab(s) orally once a day x 3 days 4 tab(s) orally once a day x 3 days 3 tab(s) orally once a day x 3 days 2 tab(s) orally once a day x 3 days 1 tab(s) orally once a day x 3 days Active  polyethylene glycol 3350 oral powder for reconstitution 17 gram(s) orally once a day, As Needed, constipation , As needed, constipation Active  nicotine 21 mg/24 hr transdermal film, extended release 1  patch transdermal once a day Active  sulfamethoxazole-trimethoprim 1 tab(s) orally every 12 hours x 4 days Active  albuterol 2.5 mg/3 mL (0.083%) inhalation solution 3 milliliter(s) inhaled every 4-6 hours, As Needed- for Shortness of Breath  Active  ProAir HFA 90 mcg/inh inhalation aerosol 2 puff(s) inhaled every 4-6 hours, As Needed- for Shortness of Breath  Active  Daliresp 500 mcg oral tablet 1 tab(s) orally once a day Active  Spiriva 18 mcg inhalation capsule Inhale 1 cap(s) via handihaler once a day Active  omeprazole 40 mg oral delayed release capsule 1 cap(s) orally once a day Active  Aspirin Enteric Coated 81 mg oral delayed release tablet 1 tab(s) orally once a day Active  atorvastatin 10 mg oral tablet 1 tab(s) orally once a day (at bedtime) Active  lisinopril 30 mg oral tablet 1 tab(s) orally once a day Active  Vitamin D3 1000 intl units oral capsule 1 cap(s) orally once a day Active  theophylline extended release 100 mg oral tablet, extended release 1 tab(s) orally once a day Active  ferrous sulfate 325 mg oral tablet 1 tab(s) orally once a day Active  Advair Diskus 250 mcg-50 mcg inhalation powder 1 puff(s) inhaled 2 times a day Active  Flonase 50 mcg/inh nasal spray 1 spray(s) nasal once a day Active  Norco 325 mg-5 mg oral tablet 1 tab(s) orally every 6 hours, As Needed Active  gabapentin 300 mg oral capsule 2 cap(s) orally 3 times a day Active  torsemide 20 mg oral tablet 2 tab(s) orally in the morning and 1 tab in the afternoon Active  ipratropium 1 vial(s) inhaled every 4 to 6 hours, As Needed - for Shortness of Breath Active  clonazePAM 0.5 mg oral tablet 1 tab(s) orally 2 times a day Active  Cardizem CD 120 mg/24 hours oral capsule, extended release 1 cap(s) orally once a day Active   Lab Results:  Routine Chem:  10-Feb-15 20:45   B-Type Natriuretic Peptide ([facility])  908 (Result(s) reported on 25 Feb 2013 at 09:13PM.)   Radiology Results: XRay:    10-Feb-15 21:12,  Chest Portable Single View  Chest Portable Single View   REASON FOR EXAM:    Shortness of Breath  COMMENTS:       PROCEDURE: DXR - DXR PORTABLE CHEST SINGLE VIEW  - Feb 25 2013  9:12PM     CLINICAL DATA:  Shortness of breath.    EXAM:  PORTABLE CHEST - 1 VIEW    COMPARISON:  02/06/2013    FINDINGS:  Heart is enlarged. There is mild pulmonary vascular congestion but  no overt alveolar edema. Mild bilateral lower lobe atelectasis.   IMPRESSION:  1. Cardiomegaly and vascular congestion.  2. Bibasilar atelectasis.      Electronically Signed    By: Rosalie Gums M.D.    On: 02/25/2013 21:12         Verified By: Patterson Hammersmith, M.D.,    Penicillin: Rash  Vital Signs/Nurse's Notes: **Vital Signs.:   11-Feb-15 09:00  Vital Signs Type Routine  Temperature Temperature (F) 97.4  Celsius 36.3  Temperature Source axillary  Pulse Pulse 111  Respirations Respirations 24  Systolic BP Systolic BP 123  Diastolic BP (mmHg) Diastolic BP (mmHg) 76  Mean BP 91  Pulse Ox % Pulse Ox % 99  Pulse Ox Activity Level  At rest  Oxygen Delivery Non-invasive ventilation (CPAP/BIPAP)  *Intake and Output.:   Shift 11-Feb-15 15:00  Grand Totals Intake:   Output:  1575    Net:  -1575 24 Hr.:  -1575  Urine ml     Out:  1575  Length of Stay Totals Intake:   Output:  1575    Net:  -1575    Impression 74yo male w/ PMHx s/f chronic respiratory failure- end-stage COPD (O2 dependent), 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  1. A/C respiratory failure Multifactorial secondary to A/COPD and A/C combined CHF. Breathing has improved somewhat since yesterday. Continues to c/o dyspnea, cough productive of yellow sputum. Wheezy and rhoncherous on exam. Bld cx NGTD x 2.  -- Continue O2, steroids, abx, nebs and diuresis  2.  A/C combined CHF Prior EF 35-45%. BNP on arrival 900. Initial TnI WNL. Denies chest pain. Preliminary echo findings show reduced EF. Suspect hypoxia, COPD exacerbation contributed to decompensation. EKG- no evidence of ischemia, sinus tachycardia w/ multiple PACs. I/O -1575 mL. Received a dose of Lasix IV yesterday. Nearly euvolemic-- he does have some abdominal distention, mild HJR.  -- Give another dose of Lasix 40mg  IV -- Review 2D echo -- Monitor renal function, electrolytes -- Transition back to home torsemide tomorrow -- Consider switching diltiazem for Toprol-XL BID dosing once clinically improved  3. CAD Nonobstructive by 2012 cath. No chest pain or ischemic EKG changes. Initial TnI WNL.  -- Cycle troponins -- Continue ASA, ACEi, statin -- Consider switching from CCB to BB as above  4. PVD Noted on prior cath. Peripheral neuropathy and venous stasis changes noted on exam.  -- Continue ASA, statin  5. Sinus tachycardia, frequent ectopy Noted on EKG. Patient is at risk for atrial arrhythmias- MAT, PAT, PAF/flutter.  -- Treat underlying COPD exacerbation -- Avoid hypoxia -- Consider switching CCB for BB as above -- Monitor rhythm with sympathomimetics  6. COPD/ongoing tobacco abuse -- Continue management per primary team -- Tobacco cessation   Plan ATTENDING ATTESTATION: I have seen & examined the patient along with Mr. Shaune Spittlerguello, [place].  I agree with his findings, exam & impression/recommendations.  Echo performed - will read this PM. 74 y/o man with COPD, chronic combined HF, OSA admitted with acute hypoxic resp failure - that appears to be mostly pulmonary with A on C combined HF component. Looks better this am than o/n reports indicate. Mild LE edema, bu + elevated bnp, JVP & swollen abdomen -- still volume up, needs continued diuresis - would keep on IV Lasix (@ 40 mg is roughly equivalent to his home Torsemide) would continue IV until tomorrow AM & reassess after AM  dose. Tachcardia is most likely reactive - would be hesitant to use Diltiazem in setting of HF, but would not add BB until more euvolemic.  ON afterload reduction agents.  Consider adding spironolactone.  Will follow -- still needs pulmonary toilet & agressive Rx for COPD.   Electronic Signatures: Gery Prayrguello, Tiffanni Scarfo A (PA-C)  (Signed 11-Feb-15 13:14)  Authored: General Aspect/Present Illness, History and Physical  Exam, Home Medications, Labs, Radiology, Allergies, Vital Signs/Nurse's Notes, Impression/Plan Marykay Lex (MD)  (Signed 11-Feb-15 13:34)  Authored: History and Physical Exam, Review of System, Impression/Plan  Co-Signer: General Aspect/Present Illness, History and Physical Exam, Home Medications, Allergies, Vital Signs/Nurse's Notes, Impression/Plan   Last Updated: 11-Feb-15 13:34 by Marykay Lex (MD)

## 2014-05-09 NOTE — Discharge Summary (Signed)
PATIENT NAME:  Georgina PeerHORTON, Chalmers H MR#:  [id number] DATE OF BIRTH:  [birth year]  DATE OF ADMISSION:  02/25/2013 DATE OF DISCHARGE:  03/07/2013  This summary should cover the 19th and 20th of February. For previous H and P and hospital course, please see the admission H and P and interim discharge summary dictated by Dr. Nemiah CommanderKalisetti on February 18.   FINAL DIAGNOSES:   1.  Acute on chronic respiratory failure secondary to acute on chronic obstructive pulmonary disease exacerbation.  2.  Chronic respiratory failure, on 2 to 3 liters of oxygen.  3.  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  4.  Chronic anemia.  5.  Hypertension.  6.  Hyperlipidemia.  7.  Anxiety.  8.  Ongoing tobacco disorder.  9.  Hypertension.  LABORATORY DATA: Significant labs in the last few days: Creatinine of 1.31 today, was 1.1 on February 16.  HOSPITAL COURSE: Over the last couple of days, the patient has done well. He has been on his 3 liters of oxygen and feels like his shortness of breath is significantly better, and he is actually better today than his baseline. He will be discharged on a prednisone taper. He has finished his antibiotics with azithromycin here and has a CPAP at home. He does not appear to be volume overloaded, although he does have a slight bump in his creatinine. He will be discharged without the spironolactone which was started here. Furthermore, the torsemide has been changed to Lasix. He will be going home with the resumption of his home health PT and RN with 3 liters of oxygen.   DIET: Low-sodium.   ACTIVITY: As tolerated.   FOLLOWUP: Please follow up with your pulmonologist and cardiologist within 1 to 2 weeks.   DISCHARGE MEDICATIONS: Spiriva 18 mcg inhaled 1 cap once a day, omeprazole 40 mg daily, albuterol nebulizers every 4 to 6 hours as needed for shortness of breath, ProAir p.r.n., Daliresp 500 mcg 1 tab daily, aspirin 81 mg daily,  atorvastatin 10 mg once a day, lisinopril 30 mg once a day, vitamin D3 at 1000 units once a day, theophylline extended-release 1 tab once a day 100 mg, ferrous sulfate 325 mg once a day, Advair 250/50 mcg 1 puff 2 times a day, Flonase 1 spray once a day, gabapentin 300 mg 2 caps 3 times a day, clonazepam 0.5 mg 2 times a day, Cardizem 120 mg extended-release once a day, docusate sodium 100 mg 2 times a day, potassium chloride 10 mEq daily, neomycin/polymyxin B 4 drops 2 times a day, Norco 325/5 mg 1 tab 1 to 3 times a day, furosemide 40 mg daily, prednisone taper 50 mg per day and taper by 10 mg daily until done in 5 days.   PHYSICAL EXAMINATION:  VITAL SIGNS: On the day of discharge, the patient is ambulating in the room without significant dyspnea on exertion or shortness of breath. He is on 3 liters of oxygen, and temperature is 97.6, last pulse rate of 86, respiratory rate 20, blood pressure 124/74, O2 sat 97% on 3 liters.  GENERAL: The patient is a chronically ill-appearing male sitting at the edge of the bed, talking in full sentences.  HEENT: Normocephalic, atraumatic.  LUNGS: Mild basilar wheezing but very good air entry. HEART: Normal S1, S2.  ABDOMEN: Soft, nontender, nondistended.  EXTREMITIES: Show no significant pitting edema. He has significant cyanosis and clubbing.   At this point he will be discharged, and he  was instructed to follow up with his physician and check a BMP early next week for renal function tests.   CODE STATUS: The patient is full code.   TOTAL TIME SPENT: 40 minutes.   ____________________________ Krystal Eaton, MD sa:jcm D: 03/07/2013 16:09:49 ET T: 03/07/2013 17:12:35 ET JOB#: [id number]  cc: Krystal Eaton, MD, <Dictator> Lyndon Code, MD Yevonne Pax, MD Marcelle Smiling [facility] MD ELECTRONICALLY SIGNED 03/21/2013 13:09

## 2014-05-09 NOTE — Discharge Summary (Signed)
PATIENT NAME:  [name], Vernel H MR#:  [id number] DATE OF BIRTH:  08-Mar-1940  DATE OF ADMISSION:  02/03/2013 DATE OF DISCHARGE:  02/10/2013  ADMISSION DIAGNOSIS: Acute on chronic respiratory failure from acute chronic obstructive pulmonary disease exacerbation.   DISCHARGE DIAGNOSES: 1.  Acute on chronic respiratory failure secondary to acute chronic obstructive pulmonary disease exacerbation and congestive heart failure.  2.  Acute on chronic diastolic heart failure.  3.  Acute chronic obstructive pulmonary disease exacerbation.     CONSULTATIONS: Dr. Welton FlakesKhan.   LABORATORY DATA AT DISCHARGE: Sodium 135, potassium 4.3, chloride 93, bicarb 37, BUN 35, creatinine 1.08, glucose 220, calcium 8.8.   HOSPITAL COURSE: This is a very pleasant 74 year old male with a history of COPD who presents with acute on chronic respiratory failure. For further details, please refer to the H and P. 1.  Acute on chronic respiratory failure secondary to acute COPD exacerbation and acute on chronic diastolic heart failure. For his COPD, the patient was on BiPAP at night which he has at home. He had some bronchospasm and wheezing which has improved. His lungs on discharge have some mild wheezing but he has good airflow. Pulmonary was involved. He was initially on IV steroids which was weaned to p.o. steroids. He was continued on his outpatient medications including theophylline, nebulizers and oxygen. His sputum cultures came back positive for Klebsiella. He was changed to Bactrim per the sensitivities. He will continue his inhalers.  2.  For acute on chronic diastolic heart failure. He was on IV Lasix.  He has diuresed well.  He will be discharged with p.o.  3.  I would recommend 3 liters of oxygen at rest oxygen to keep oxygen about 90% and on exertion may need increase to 4 liters.  4.  Constipation.  The patient will need MiraLax and p.r.n. Colace.  5.  Chronic anemia. The patient was continued on iron supplements.  6.   Hypertension.  The patient was continued on outpatient medications.  7.  Hyperlipidemia, on atorvastatin. 8.  Anxiety. The patient is on Klonopin.   DISCHARGE MEDICATIONS: 1.  Spiriva 18 mcg daily.  2.  Omeprazole 40 mg daily.  3.  Albuterol 3 mL q.4 to 6 hours p.r.n. shortness of breath.  4.  ProAir 2 puffs q.4 to 6 hours p.r.n.   5.  Daliresp 500 mcg daily.  6.  Aspirin 81 mg daily.  7.  Atorvastatin 10 mg at bedtime.  8.  Lisinopril 30 mg daily.  8.  Vitamin D3 1000 international units daily.  10.  Theophylline 100 mg daily.  11.  Ferrous sulfate 325 mg daily.  12.  Advair Diskus 250/50 b.i.d. 13.  Flonase 50 mcg daily.  14.  Norco 5/325 q.6 hours p.r.n.  15.  Gabapentin 300 mg 2 tablets t.i.d.  16.  Torsemide 20 mg 2 tablets in the morning and 1 in the afternoon.  17.  Ipratropium q.4 to 6 hours p.r.n.  18.  Clonazepam 0.5 mg b.i.d.  19.  Cardizem 120 mg daily.  20.  Prednisone taper starting at 60 mg, taper x 10 mg every 3 days.  21.  Polyethylene glycol 17 grams daily p.r.n.  22.  Nicotine 21 mg per 24 hours.  23.  Bactrim 1 tablet b.i.d. for 4 days,    DISCHARGE HOME HEALTH: With physical therapy, nurse and nurse aide for assessment and gait.  DISCHARGE OXYGEN: At 3 liters to keep sats greater than 90%.   DISCHARGE DIET: Low sodium.  DISCHARGE ACTIVITY: As tolerated.   DISCHARGE FOLLOWUP: The patient will follow up with Dr. Freda Munro and Dr. Beverely Risen in 1 week.   TIME SPENT: 35 minutes. The patient is medically stable for discharge.   ____________________________ Kilah Drahos P. Juliene Pina, MD spm:cs D: 02/10/2013 13:25:37 ET T: 02/10/2013 14:57:03 ET JOB#: [id number]  cc: Sitara Cashwell P. Juliene Pina, MD, <Dictator> Yevonne Pax, MD Lyndon Code, MD Janyth Contes Jakari Sada MD ELECTRONICALLY SIGNED 02/11/2013 15:23

## 2014-05-09 NOTE — Consult Note (Signed)
Chief Complaint:  Subjective/Chief Complaint ABG reveiwed looking better less dyspnea   VITAL SIGNS/ANCILLARY NOTES: **Vital Signs.:   23-Jan-15 04:22  Vital Signs Type Routine  Temperature Temperature (F) 97.8  Celsius 36.5  Temperature Source oral  Pulse Pulse 76  Respirations Respirations 20  Systolic BP Systolic BP 117  Diastolic BP (mmHg) Diastolic BP (mmHg) 75  Mean BP 89  Pulse Ox % Pulse Ox % 90  Pulse Ox Activity Level  At rest  Oxygen Delivery 4L  *Intake and Output.:   Shift 23-Jan-15 07:00  Grand Totals Intake:   Output:  300    Net:  -300 24 Hr.:  -320  Urine ml     Out:  300  Length of Stay Totals Intake:  1050 Output:  3000    Net:  -1950   Brief Assessment:  GEN no acute distress   Cardiac Regular  -- thrills   Respiratory normal resp effort  clear BS  no use of accessory muscles   Gastrointestinal Normal   Gastrointestinal details normal Soft  Bowel sounds normal  No organomegaly   EXTR negative cyanosis/clubbing   Lab Results: Lab:  22-Jan-15 13:50   pH (ABG) 7.43  PCO2  66  PO2  60  FiO2 36  Base Excess  16.0  HCO3  43.8  O2 Saturation 91.6  O2 Device CANNULA  Specimen Site (ABG) RT RADIAL  Specimen Type (ABG) ARTERIAL  Patient Temp (ABG) 37.0 (Result(s) reported on 06 Feb 2013 at 01:56PM.)  Routine Sero:  23-Jan-15 07:28   Occult Blood, Feces NEGATIVE (Result(s) reported on 07 Feb 2013 at 07:54AM.)   Radiology Results: XRay:    22-Jan-15 14:22, Chest PA and Lateral  Chest PA and Lateral   REASON FOR EXAM:    f/u chf  COMMENTS:       PROCEDURE: DXR - DXR CHEST PA (OR AP) AND LATERAL  - Feb 06 2013  2:22PM     CLINICAL DATA:  Follow-up of congestive heart failure.    EXAM:  CHEST  2 VIEW    COMPARISON:  February 03, 2013 portable chest x-ray.    FINDINGS:  AP and lateral chest films reveal the lungs to be mildly  hyperinflated. There is persistent hazy density at the right lung  base consistent with subsegmental  atelectasis. There is no  significant pleural effusion. The pulmonary interstitial markings  have improved slightly but remain increased in prominence. The  cardiac silhouette remains enlarged. The pulmonary vascularity is  less engorged centrally. The observed portions of the bony thorax  exhibit no acute abnormalities.     IMPRESSION:  The appearance of the chest is consistent with improving congestive  heart failure. There remains subsegmental atelectasis at the right  lung base and the interstitial markings bilaterally remain mildly  increased. There is likely underlying COPD.      Electronically Signed    By: David  Swazil[place]Jordan    On: 02/06/2013 15:59         Verified By: DAVID A. Swazil[place]JORDAN, M.D., MD   Assessment/Plan:  Assessment/Plan:  Assessment 1. Acute resp failure -abg improved -bipap as needed -titrate oxygen  2. COPD -inhalers -smoking cessation -prognosis is guarded   Electronic Signatures: Yevonne PaxKhan, Saadat A (MD)  (Signed 23-Jan-15 09:36)  Authored: Chief Complaint, VITAL SIGNS/ANCILLARY NOTES, Brief Assessment, Lab Results, Radiology Results, Assessment/Plan   Last Updated: 23-Jan-15 09:36 by Yevonne PaxKhan, Saadat A (MD)

## 2014-05-09 NOTE — Discharge Summary (Signed)
PATIENT NAME:  [name], STAVOLA MR#:  [id number] DATE OF BIRTH:  02-Sep-1940  DATE OF ADMISSION:  05/07/2013 DATE OF DISCHARGE:  05/12/2013  ADMITTING PHYSICIAN: Shreyang H. Allena Katz, MD  DISCHARGING PHYSICIAN:  Enid Baas, MD  PRIMARY CARE PHYSICIAN: Lyndon Code, MD  CONSULTATIONS IN THE HOSPITAL:  1.  Pulmonary consultation by Dr. Freda Munro.  2.  Cardiology consultation by Dr. Mariah Milling.  3.  Palliative care consultation by Dr. Harriett Sine Phifer.    DISCHARGE DIAGNOSES:  1.  Acute on chronic respiratory failure.  2.  Chronic obstructive pulmonary disease exacerbation.  3.  Chronic systolic congestive heart failure.  4.  Hypertension.  5.  Hyperlipidemia.  6.  Tobacco use disorder.   DISCHARGE HOME MEDICATIONS:  1.  Albuterol nebulizer 3 mL q.4 to 6 hours p.r.n. for wheezing.  2.  Daliresp 500 mcg p.o. daily.  3.  Aspirin 81 mg p.o. daily.  4.  Atorvastatin 10 mg p.o. at bedtime.  5.  Vitamin D3 1000 international units daily.  6.  Theophylline 100 mg p.o. daily.  7.  Ferrous sulfate 325 mg p.o. daily.  8.  Advair 250/50 one puff b.i.d.  9.  Flonase nasal spray 50 mcg per each nostril once daily.  10.  Gabapentin 600 mg p.o. 3 times a day.  11.  Klonopin 0.5 mg p.o. b.i.d.  12.  Cardizem 120 mg p.o. daily.  13.  Potassium chloride 10 mEq p.o. daily.  14.  Colace 100 mg p.o. b.i.d.  15.  Atrovent 2.5 mL q.4 to 6 hours p.r.n. for wheezing via nebulizer.  16.  Lisinopril 30 mg p.o. daily.  17.  Omeprazole 40 mg p.o. daily.  18.  ProAir inhaler 2 puffs q.4 to 6 hours as needed for shortness of breath.  19.  Spiriva inhalation capsule daily.  20.  Aldactone 25 mg p.o. daily.  21.  Torsemide 20 mg in the afternoon.  22.  Torsemide 40 mg in the morning.  23.  Norco 5/325 mg 1 tablet 3 times a day for pain as needed.  24.  Prednisone taper over 12 days.  25.  Roxanol 20 mg per mL oral concentrate 0.25 mL every 6 hours as needed for shortness of breath.   DISCHARGE DIET:  Low-sodium diet.   DISCHARGE ACTIVITY: As tolerated.   HOME OXYGEN: Three liters home oxygen.    FOLLOWUP INSTRUCTIONS:   1.  PCP followup in 1 week.  2.  Hospice services.  LABORATORY, DIAGNOSTIC AND RADIOLOGICAL DATA:   1.  Sputum cultures growing rare Klebsiella. 2.  WBC 7.0, hemoglobin 12.3, hematocrit 39.1 and platelet count 138.  3.  Sodium 141, potassium 3.9, chloride 93, bicarbonate 44, BUN 24, creatinine 1.1 and glucose 208 and calcium of 8.8.  4.  Chest x-ray on this admission showing cardiomegaly, pulmonary venous hypertension, small right pleural effusion.   BRIEF HOSPITAL COURSE: [name] is a 74 year old Caucasian male with progressively end-stage chronic obstructive pulmonary disease on 3 liters home oxygen, history of systolic congestive heart failure, ejection fraction of 35%, obstructive sleep apnea on CPAP, coronary artery disease, hypertension, hyperlipidemia, who had multiple admissions to the hospital for chronic obstructive pulmonary disease exacerbation, is presenting this time and got admitted on 05/07/2013 for chronic obstructive pulmonary disease exacerbation.  1.  Acute on chronic obstructive pulmonary disease exacerbation. Multiple admissions for the same and the patient continues to smoke as an outpatient. He has been gradually weaned off higher amounts of oxygen to 3 liters, which was  what he was discharged on about 3 months ago. He was on a higher dose of IV steroids, finished off a course of Levaquin and also inhalers and nebulizer. At this time, the patient is comfortably breathing on 3 liters at rest and gets exertional dyspnea which his new baseline. He was seen by palliative care consultation and they recommended hospice services considering his end-stage chronic obstructive pulmonary disease and patient has agreed for that.  [name] is the patient's CAPS worker and the patient appointed her as his healthcare power of attorney.  The patient wants to become a  DO NOT RESUSCITATE after discussion with palliative care. The patient agreed for hospice services, which is appropriate at this time, and he will be discharged also on Roxanol for symptomatic relief. He is being discharged home with hospice services.  2.  Systolic congestive heart failure with acute exacerbation. Continue torsemide and potassium supplements.  He is also on Aldactone, lisinopril and torsemide.   3.  All his other home medications were continued at the time of discharge.  His condition has been otherwise uneventful.   DISCHARGE CONDITION: Guarded with poor long-term prognosis.   DISCHARGE DISPOSITION: Home with hospice services.   TIME SPENT ON DISCHARGE: 40 minutes.   ____________________________ Enid Baasadhika Rivkah Wolz, MD rk:cs D: 05/12/2013 14:05:04 ET T: 05/12/2013 14:35:37 ET JOB#: [id number]  cc: Enid Baasadhika Ariadna Setter, MD, <Dictator> Lyndon CodeFozia M. Khan, MD Enid BaasADHIKA Allex Lapoint MD ELECTRONICALLY SIGNED 06/04/2013 13:51

## 2015-03-11 IMAGING — CR DG CHEST 1V PORT
1 series · 2 of 2 positions shown · non-contrast
Comparison: May 16, 2012

CLINICAL DATA: Preoperative for gastric bypass surgery; shortness
of breath

EXAM:
PORTABLE CHEST - 1 VIEW

[Series 1: ap · 0.17mm/px · 2 of 2 slices shown]
[im 1/2]
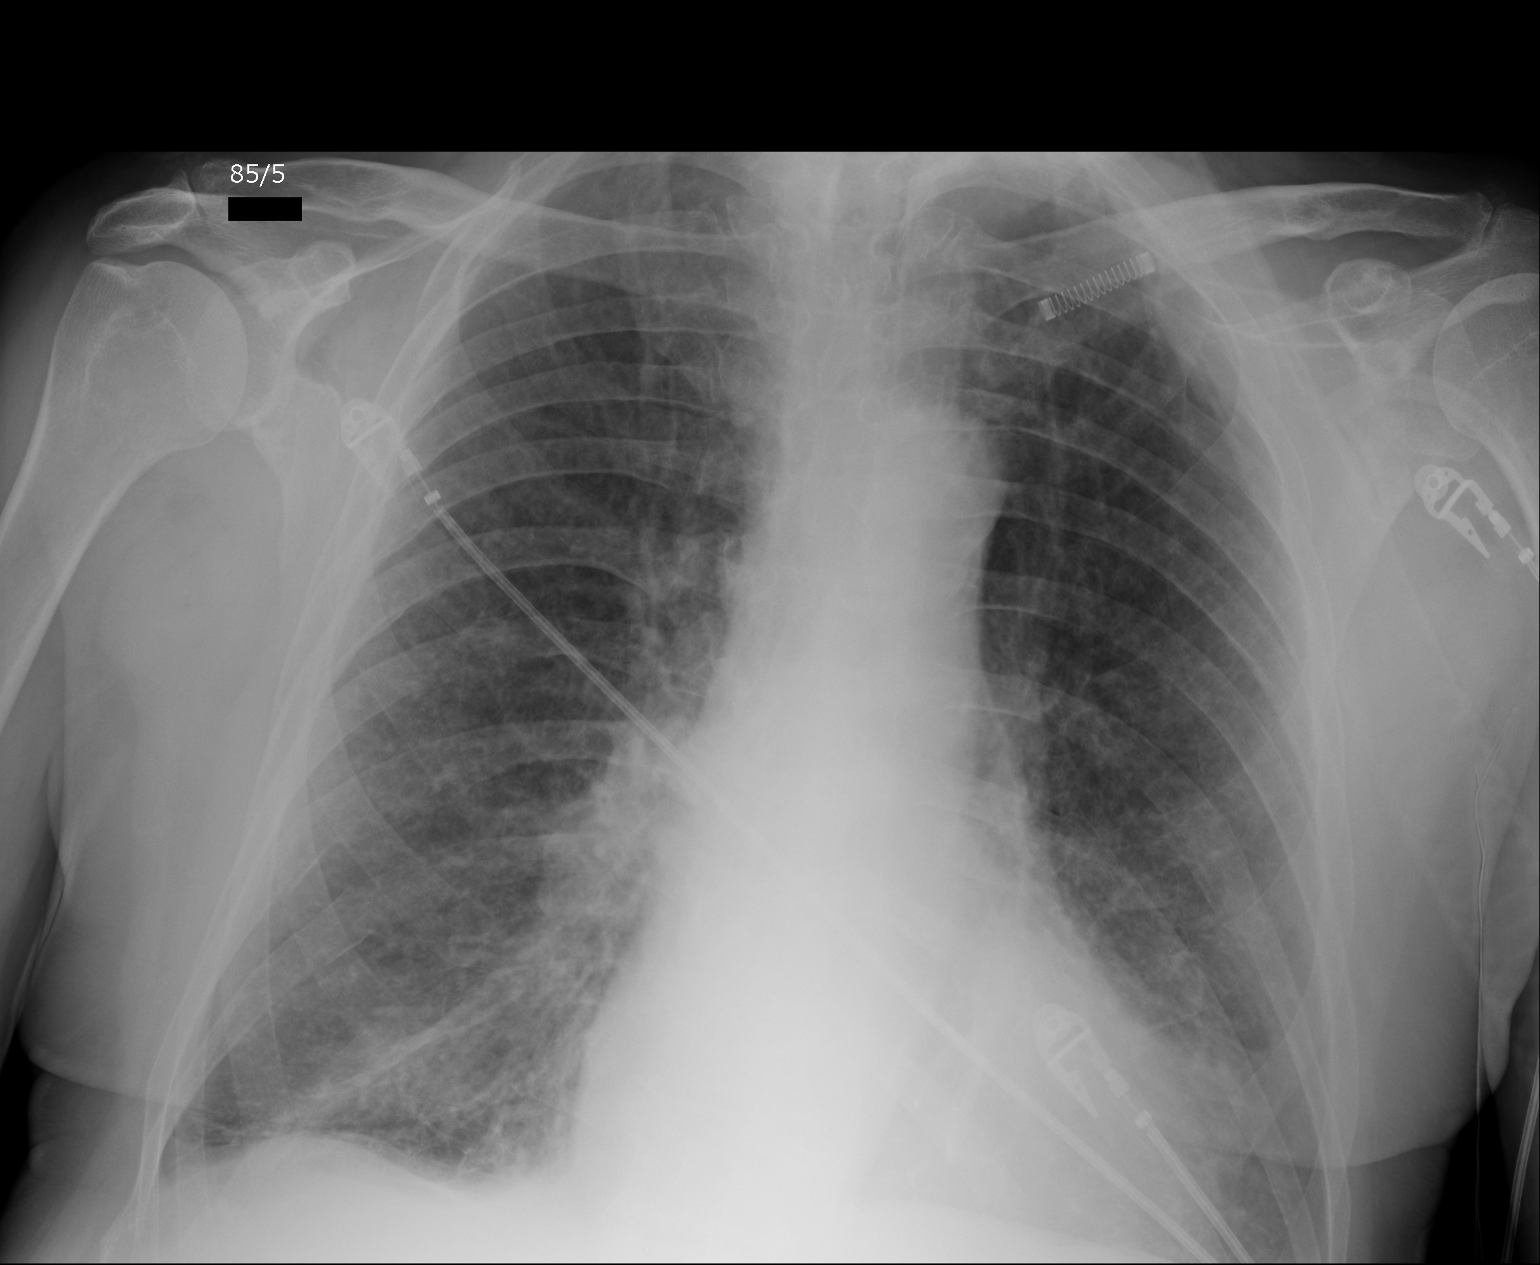
[im 2/2]
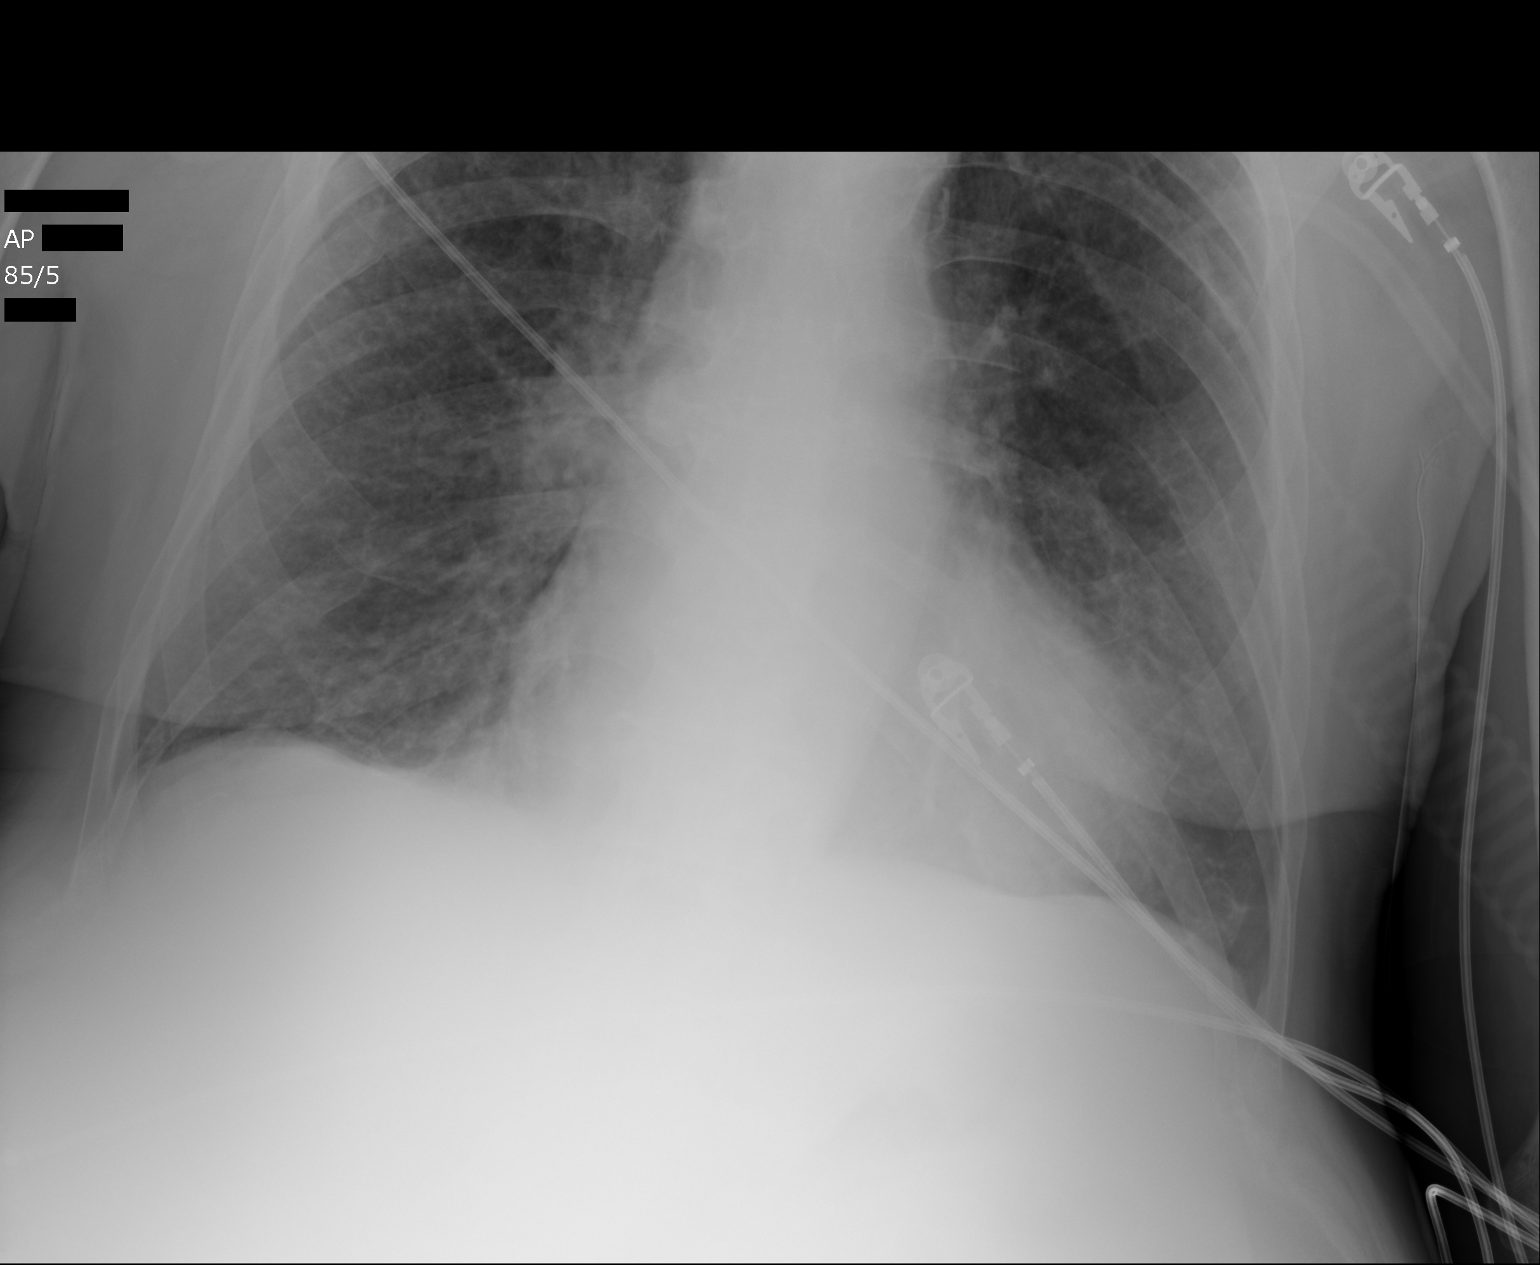

[2 of 2 positions shown; findings below may reference images not displayed]

FINDINGS: There is a degree of underlying emphysematous change. Interstitium
is prominent, and mild edema is suspected. Lungs are otherwise
clear. The heart is mildly enlarged given underlying emphysema.
Pulmonary vascularity is within normal limits. No adenopathy. No
bone lesions.
IMPRESSION: Findings felt to represent mild congestive heart failure
superimposed on emphysematous change. No airspace consolidation.

## 2015-04-02 IMAGING — CR DG CHEST 1V PORT
1 series · 2 of 2 positions shown · non-contrast
Comparison: 02/06/2013

CLINICAL DATA: Shortness of breath.

EXAM:
PORTABLE CHEST - 1 VIEW

[Series 1: ap · 0.17mm/px · 2 of 2 slices shown]
[im 1/2]
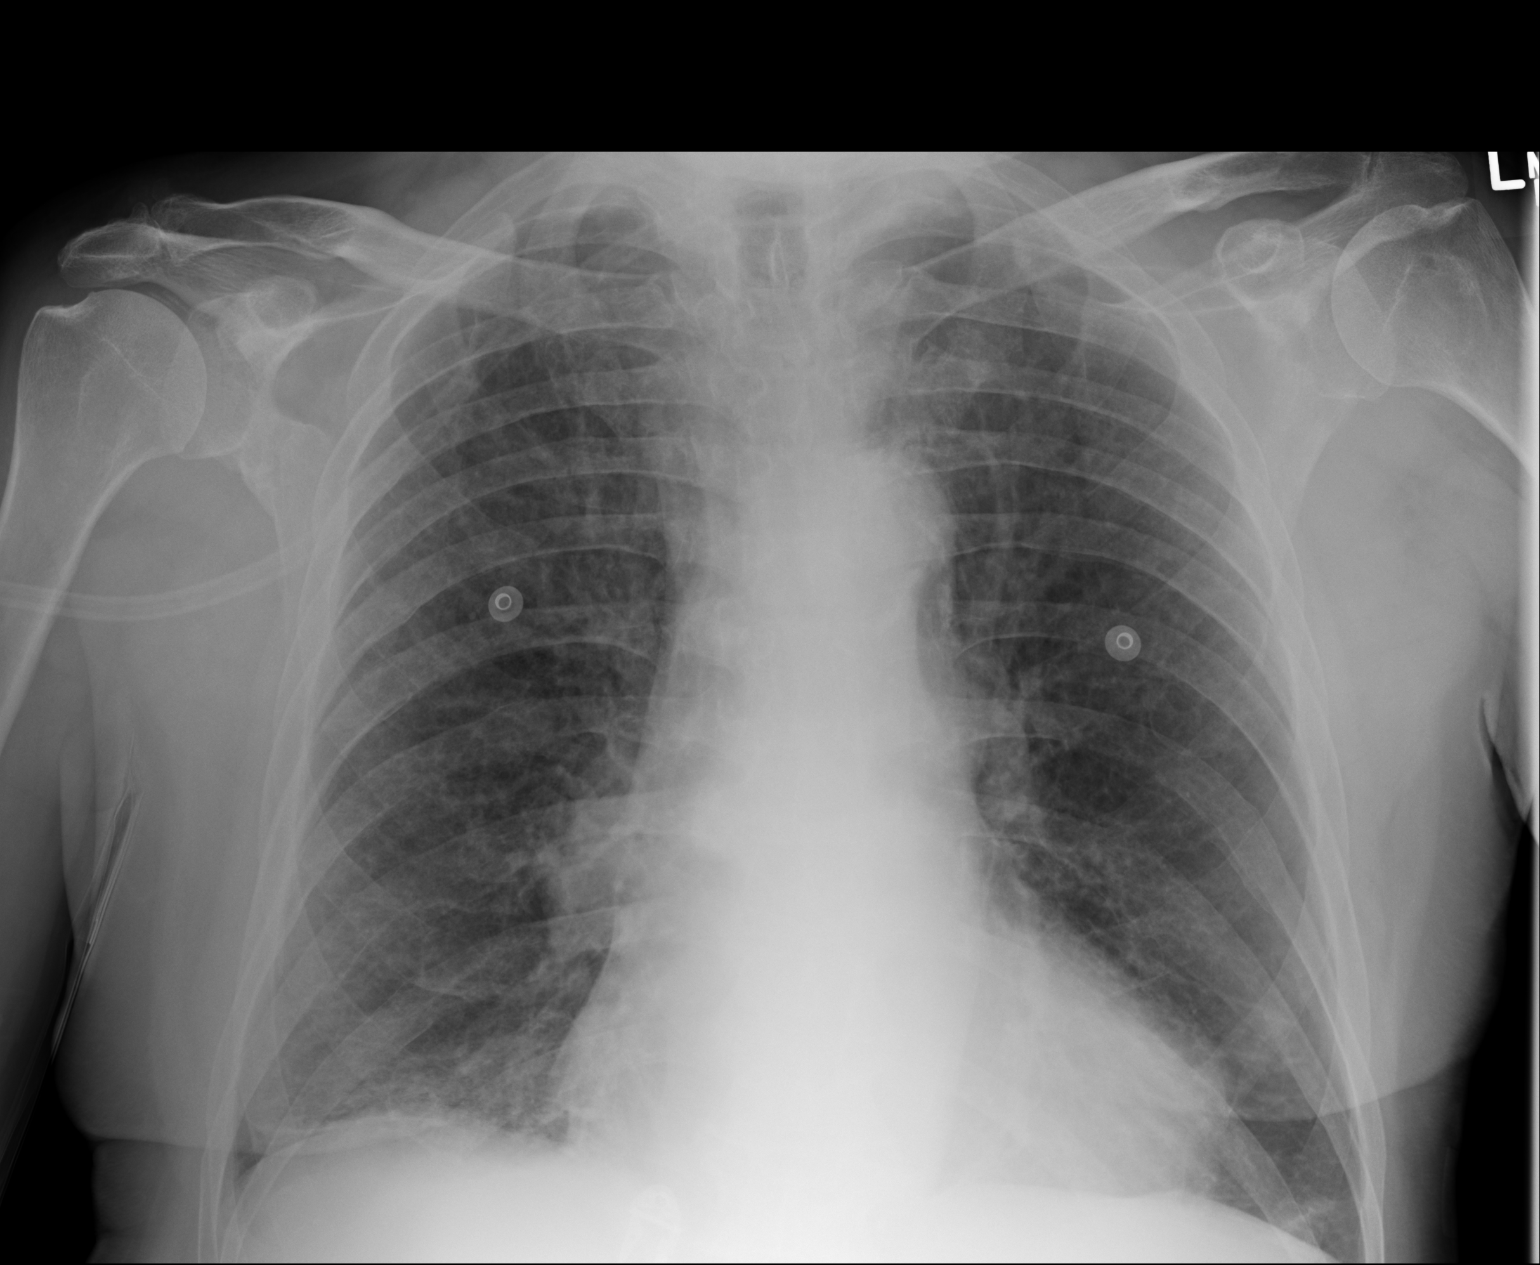
[im 2/2]
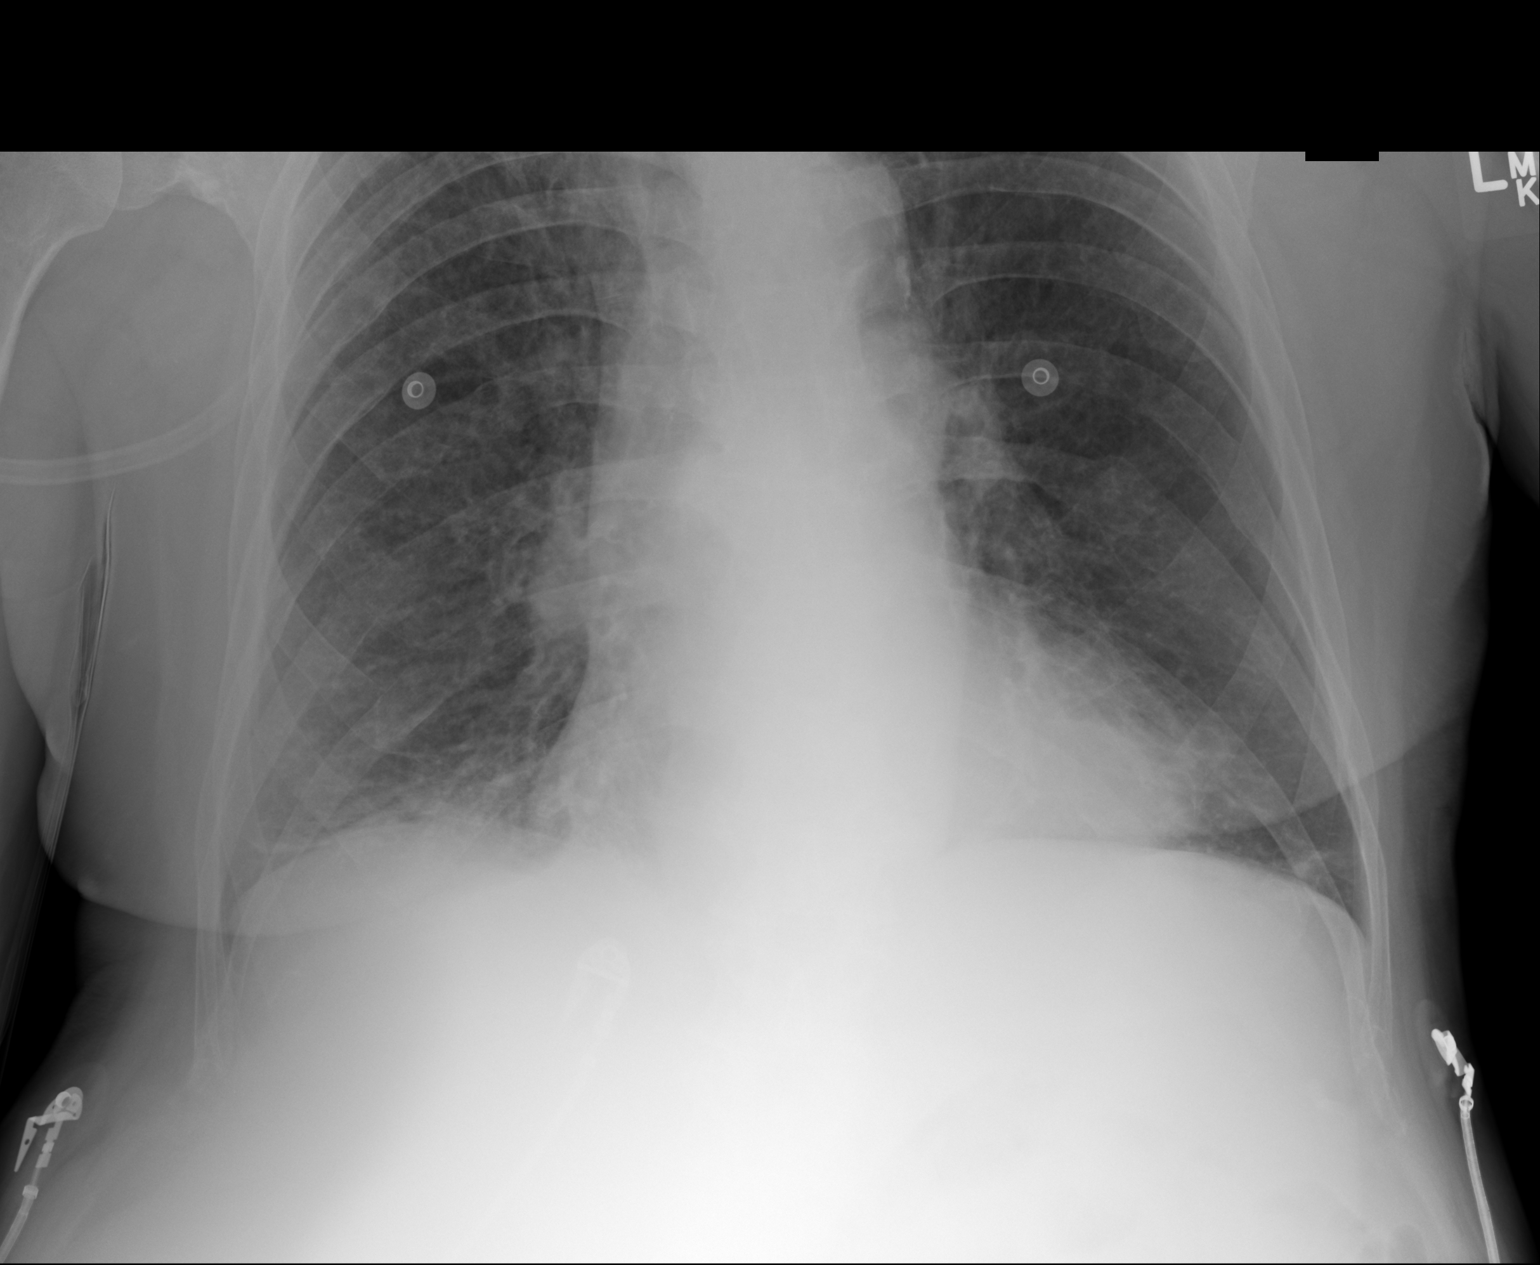

[2 of 2 positions shown; findings below may reference images not displayed]

FINDINGS: Heart is enlarged. There is mild pulmonary vascular congestion but
no overt alveolar edema. Mild bilateral lower lobe atelectasis.
IMPRESSION: 1. Cardiomegaly and vascular congestion.
2. Bibasilar atelectasis.
